# Patient Record
Sex: Female | Born: 1975 | Race: White | Hispanic: No | Marital: Married | Smoking: Never smoker
Health system: Southern US, Community
[De-identification: ages and names within clinical notes are randomized; demographics above are authoritative.]

## PROBLEM LIST (undated history)

## (undated) ENCOUNTER — Inpatient Hospital Stay (HOSPITAL_COMMUNITY): Payer: Self-pay

## (undated) DIAGNOSIS — Z8619 Personal history of other infectious and parasitic diseases: Secondary | ICD-10-CM

## (undated) DIAGNOSIS — I10 Essential (primary) hypertension: Secondary | ICD-10-CM

## (undated) DIAGNOSIS — O24419 Gestational diabetes mellitus in pregnancy, unspecified control: Secondary | ICD-10-CM

## (undated) DIAGNOSIS — IMO0001 Reserved for inherently not codable concepts without codable children: Secondary | ICD-10-CM

## (undated) DIAGNOSIS — E039 Hypothyroidism, unspecified: Secondary | ICD-10-CM

## (undated) DIAGNOSIS — F419 Anxiety disorder, unspecified: Secondary | ICD-10-CM

## (undated) DIAGNOSIS — R51 Headache: Secondary | ICD-10-CM

## (undated) DIAGNOSIS — R030 Elevated blood-pressure reading, without diagnosis of hypertension: Secondary | ICD-10-CM

## (undated) HISTORY — PX: CHOLECYSTECTOMY: SHX55

## (undated) HISTORY — DX: Personal history of other infectious and parasitic diseases: Z86.19

## (undated) HISTORY — PX: TONSILLECTOMY: SUR1361

## (undated) HISTORY — DX: Gestational diabetes mellitus in pregnancy, unspecified control: O24.419

## (undated) HISTORY — DX: Elevated blood-pressure reading, without diagnosis of hypertension: R03.0

## (undated) HISTORY — DX: Reserved for inherently not codable concepts without codable children: IMO0001

---

## 1999-08-29 ENCOUNTER — Emergency Department (HOSPITAL_COMMUNITY): Admission: EM | Admit: 1999-08-29 | Discharge: 1999-08-29 | Payer: Self-pay | Admitting: Emergency Medicine

## 2003-12-25 ENCOUNTER — Emergency Department (HOSPITAL_COMMUNITY): Admission: EM | Admit: 2003-12-25 | Discharge: 2003-12-25 | Payer: Self-pay | Admitting: Emergency Medicine

## 2007-10-09 ENCOUNTER — Emergency Department (HOSPITAL_COMMUNITY): Admission: EM | Admit: 2007-10-09 | Discharge: 2007-10-09 | Payer: Self-pay | Admitting: Emergency Medicine

## 2010-04-24 ENCOUNTER — Encounter: Admission: RE | Admit: 2010-04-24 | Discharge: 2010-04-24 | Payer: Self-pay | Admitting: Family Medicine

## 2010-06-15 ENCOUNTER — Ambulatory Visit (HOSPITAL_COMMUNITY)
Admission: RE | Admit: 2010-06-15 | Discharge: 2010-06-15 | Payer: Self-pay | Source: Home / Self Care | Attending: General Surgery | Admitting: General Surgery

## 2010-07-23 ENCOUNTER — Encounter: Payer: Self-pay | Admitting: Family Medicine

## 2010-09-11 LAB — GLUCOSE, CAPILLARY: Glucose-Capillary: 131 mg/dL — ABNORMAL HIGH (ref 70–99)

## 2010-09-12 LAB — SURGICAL PCR SCREEN
MRSA, PCR: NEGATIVE
Staphylococcus aureus: NEGATIVE

## 2010-09-12 LAB — COMPREHENSIVE METABOLIC PANEL
ALT: 45 U/L — ABNORMAL HIGH (ref 0–35)
AST: 40 U/L — ABNORMAL HIGH (ref 0–37)
Albumin: 4.2 g/dL (ref 3.5–5.2)
CO2: 27 mEq/L (ref 19–32)
Chloride: 100 mEq/L (ref 96–112)
Creatinine, Ser: 0.78 mg/dL (ref 0.4–1.2)
GFR calc Af Amer: >60 mL/min (ref >60)
Sodium: 138 mEq/L (ref 135–145)
Total Bilirubin: 1.1 mg/dL (ref 0.3–1.2)

## 2010-09-12 LAB — CBC
Hemoglobin: 14.4 g/dL (ref 12.0–15.0)
MCH: 29 pg (ref 26.0–34.0)
Platelets: 210 10*3/uL (ref 150–400)
RBC: 4.96 MIL/uL (ref 3.87–5.11)
WBC: 10.6 10*3/uL — ABNORMAL HIGH (ref 4.0–10.5)

## 2010-09-12 LAB — DIFFERENTIAL
Basophils Absolute: 0.1 10*3/uL (ref 0.0–0.1)
Eosinophils Absolute: 0.2 10*3/uL (ref 0.0–0.7)
Eosinophils Relative: 1 % (ref 0–5)
Lymphocytes Relative: 35 % (ref 12–46)
Lymphs Abs: 3.7 10*3/uL (ref 0.7–4.0)
Monocytes Absolute: 0.8 10*3/uL (ref 0.1–1.0)

## 2011-03-27 LAB — POCT URINALYSIS DIP (DEVICE)
Glucose, UA: NEGATIVE
Ketones, ur: NEGATIVE
Operator id: 239701
Specific Gravity, Urine: 1.015

## 2011-11-17 ENCOUNTER — Emergency Department (HOSPITAL_COMMUNITY): Payer: Medicaid Other

## 2011-11-17 ENCOUNTER — Encounter (HOSPITAL_COMMUNITY): Payer: Self-pay | Admitting: *Deleted

## 2011-11-17 ENCOUNTER — Emergency Department (HOSPITAL_COMMUNITY)
Admission: EM | Admit: 2011-11-17 | Discharge: 2011-11-17 | Disposition: A | Discharge status: Home / Self Care | Payer: Medicaid Other | Attending: Emergency Medicine | Admitting: Emergency Medicine

## 2011-11-17 DIAGNOSIS — O24919 Unspecified diabetes mellitus in pregnancy, unspecified trimester: Secondary | ICD-10-CM | POA: Insufficient documentation

## 2011-11-17 DIAGNOSIS — R109 Unspecified abdominal pain: Secondary | ICD-10-CM | POA: Insufficient documentation

## 2011-11-17 DIAGNOSIS — E079 Disorder of thyroid, unspecified: Secondary | ICD-10-CM | POA: Insufficient documentation

## 2011-11-17 DIAGNOSIS — O219 Vomiting of pregnancy, unspecified: Secondary | ICD-10-CM

## 2011-11-17 DIAGNOSIS — O169 Unspecified maternal hypertension, unspecified trimester: Secondary | ICD-10-CM | POA: Insufficient documentation

## 2011-11-17 DIAGNOSIS — R10816 Epigastric abdominal tenderness: Secondary | ICD-10-CM | POA: Insufficient documentation

## 2011-11-17 DIAGNOSIS — O210 Mild hyperemesis gravidarum: Secondary | ICD-10-CM | POA: Insufficient documentation

## 2011-11-17 DIAGNOSIS — E039 Hypothyroidism, unspecified: Secondary | ICD-10-CM | POA: Insufficient documentation

## 2011-11-17 DIAGNOSIS — E119 Type 2 diabetes mellitus without complications: Secondary | ICD-10-CM | POA: Insufficient documentation

## 2011-11-17 HISTORY — DX: Hypothyroidism, unspecified: E03.9

## 2011-11-17 HISTORY — DX: Essential (primary) hypertension: I10

## 2011-11-17 LAB — DIFFERENTIAL
Eosinophils Relative: 1 % (ref 0–5)
Lymphocytes Relative: 24 % (ref 12–46)
Lymphs Abs: 3.1 10*3/uL (ref 0.7–4.0)
Monocytes Absolute: 0.9 10*3/uL (ref 0.1–1.0)
Monocytes Relative: 7 % (ref 3–12)

## 2011-11-17 LAB — CBC
HCT: 39.3 % (ref 36.0–46.0)
MCV: 82.7 fL (ref 78.0–100.0)
RBC: 4.75 MIL/uL (ref 3.87–5.11)
WBC: 13.3 10*3/uL — ABNORMAL HIGH (ref 4.0–10.5)

## 2011-11-17 LAB — COMPREHENSIVE METABOLIC PANEL
ALT: 24 U/L (ref 0–35)
BUN: 5 mg/dL — ABNORMAL LOW (ref 6–23)
CO2: 22 mEq/L (ref 19–32)
Calcium: 9.4 mg/dL (ref 8.4–10.5)
Creatinine, Ser: 0.57 mg/dL (ref 0.50–1.10)
GFR calc Af Amer: >90 mL/min (ref >90)
GFR calc non Af Amer: >90 mL/min (ref >90)
Glucose, Bld: 105 mg/dL — ABNORMAL HIGH (ref 70–99)
Sodium: 135 mEq/L (ref 135–145)

## 2011-11-17 MED ORDER — ONDANSETRON 4 MG PO TBDP
8.0000 mg | ORAL_TABLET | Freq: Once | ORAL | Status: AC
  Administered 2011-11-17: 8 mg via ORAL
  Filled 2011-11-17: qty 2

## 2011-11-17 MED ORDER — SODIUM CHLORIDE 0.9 % IV SOLN
Freq: Once | INTRAVENOUS | Status: AC
  Administered 2011-11-17: 04:00:00 via INTRAVENOUS

## 2011-11-17 MED ORDER — ONDANSETRON HCL 4 MG/2ML IJ SOLN
4.0000 mg | Freq: Once | INTRAMUSCULAR | Status: AC
  Administered 2011-11-17: 4 mg via INTRAVENOUS
  Filled 2011-11-17: qty 2

## 2011-11-17 MED ORDER — ONDANSETRON HCL 8 MG PO TABS: 8.0000 mg | ORAL_TABLET | Freq: Three times a day (TID) | ORAL | Status: AC | PRN

## 2011-11-17 NOTE — ED Notes (Signed)
Patient to ultrasound

## 2011-11-17 NOTE — ED Provider Notes (Signed)
History     CSN: [id number]  Arrival date & time 11/17/11  0213   First MD Initiated Contact with Patient 11/17/11 [phone]      Chief Complaint  Patient presents with  . Emesis    (Consider location/radiation/quality/duration/timing/severity/associated sxs/prior treatment) HPI Comments: Patient is [redacted] weeks pregnant.  Having epigastric discomfort, repeat vomiting for the past two days.    Patient is a 36 y.o. female presenting with vomiting. The history is provided by the patient.  Emesis  This is a new problem. The current episode started 2 days ago. The problem occurs continuously. The problem has not changed since onset.The emesis has an appearance of stomach contents. There has been no fever. Associated symptoms include abdominal pain. Pertinent negatives include no chills, no diarrhea and no fever.    Past Medical History  Diagnosis Date  . Hypothyroid   . Hypertension   . Diabetes mellitus     Past Surgical History  Procedure Date  . Cholecystectomy     History reviewed. No pertinent family history.  History  Substance Use Topics  . Smoking status: Never Smoker   . Smokeless tobacco: Not on file  . Alcohol Use: No    OB History    Grav Para Term Preterm Abortions TAB SAB Ect Mult Living   1               Review of Systems  Constitutional: Negative for fever and chills.  Gastrointestinal: Positive for vomiting and abdominal pain. Negative for diarrhea.  All other systems reviewed and are negative.    Allergies  Clindamycin/lincomycin and Penicillins  Home Medications   Current Outpatient Rx  Name Route Sig Dispense Refill  . DOXYLAMINE SUCCINATE (SLEEP) 25 MG PO TABS Oral Take 25 mg by mouth at bedtime as needed. For sleep      BP 154/83  Pulse 91  Temp(Src) 98.5 F (36.9 C) (Oral)  Resp 18  SpO2 99%  LMP 09/21/2011  Physical Exam  Nursing note and vitals reviewed. Constitutional: She is oriented to person, place, and time. She appears  well-developed and well-nourished. No distress.  HENT:  Head: Normocephalic and atraumatic.  Neck: Normal range of motion. Neck supple.  Cardiovascular: Normal rate and regular rhythm.  Exam reveals no gallop and no friction rub.   No murmur heard. Pulmonary/Chest: Effort normal and breath sounds normal. No respiratory distress. She has no wheezes.  Abdominal: Soft. Bowel sounds are normal. She exhibits no distension. There is no rebound and no guarding.       Mild ttp in the epigastrium.    Musculoskeletal: Normal range of motion.  Neurological: She is alert and oriented to person, place, and time.  Skin: Skin is warm and dry. She is not diaphoretic.    ED Course  Procedures (including critical care time)   Labs Reviewed  CBC  DIFFERENTIAL  COMPREHENSIVE METABOLIC PANEL  LIPASE, BLOOD  HCG, QUANTITATIVE, PREGNANCY   No results found.   No diagnosis found.    MDM  The patient presented with epigastric abd discomfort and vomiting, but no diarrhea.  She is a G1 who is 7-[redacted] weeks pregnant.  Labs look okay and hcg is 43k.  An ultrasound has been ordered to confirm an iup.  This is pending.  At this point, the care will be signed out to Dr. Effie Shy at shift change.        Geoffery Lyons, MD 11/17/11 [phone]

## 2011-11-17 NOTE — ED Notes (Signed)
Pt states that she has been throwing up everything that she tries to eat for the past 2 days. Pt states that she has intermittant sharp abdmonial pain. Pt states she is approxiamtely [redacted] weeks pregnant.

## 2011-11-17 NOTE — ED Provider Notes (Signed)
The patient seemed to evaluate after ultrasound returned. She is comfortable, nauseated, and states that she is ready to leave. The ultrasound shows an uncomplicated [redacted] week gestation. This states that she has a planned OB follow up this week. Repeat vital signs are reassuring.  Evaluation is consistent with morning sickness of pregnancy. Doubt pregnancy complication, that will threaten the fetus.  Plan: Home Medications- Zofran; Home Treatments- advance diet; Recommended follow up- OB f/u asap  Flint Melter, MD 11/17/11 [phone]

## 2011-11-17 NOTE — Discharge Instructions (Signed)
Take small sips of fluids frequently, and eat, bland foods. See an OB doctor as soon as possible for pregnancy care. Go to [facility] hospital if needed for problems.   Morning Sickness Morning sickness is when you feel sick to your stomach (nauseous) during pregnancy. You may feel sick to your stomach and throw up (vomit). You may feel sick in the morning, but you can feel this way any time of day. Some women feel very sick to their stomach and cannot stop throwing up (hyperemesis gravidarum). HOME CARE  Take multivitamins as told by your doctor. Taking multivitamins before getting pregnant can stop or lessen the harshness of morning sickness.   Eat dry toast or unsalted crackers before getting out of bed.   Eat 5 to 6 small meals a day.   Eat dry and bland foods like rice and baked potatoes.   Do not drink liquids with meals. Drink between meals.   Do not eat greasy, fatty, or spicy foods.   Have someone cook for you if the smell of food causes you to feel sick or throw up.   Do not take vitamins with iron, or as told by your doctor.   Eat protein when you need a snack (nuts, yogurt, cheese).   Eat unsweetened gelatins for dessert.   Wear a bracelet used for sea sickness (acupressure wristband).   Go to a doctor that puts thin needles into certain body points (acupuncture) to improve how you feel.   Do not smoke.   Use a humidifier to keep the air in your house free of odors.  GET HELP RIGHT AWAY IF:   You feel very sick to your stomach and cannot stop throwing up.   You pass out (faint).   You have a fever.   You need medicine to feel better.   You feel dizzy or lightheaded.   You are losing weight.   You need help knowing what to eat and what not to eat.  MAKE SURE YOU:   Understand these instructions.   Will watch your condition.   Will get help right away if you are not doing well or get worse.  Document Released: 07/26/2004 Document Revised:  06/07/2011 Document Reviewed: 09/15/2009 [facility] Patient Information 2012 [place], [place].

## 2011-11-17 NOTE — ED Notes (Signed)
Ultrasound tech ready for patient; must have a chaperone.

## 2011-12-10 ENCOUNTER — Inpatient Hospital Stay (HOSPITAL_COMMUNITY)
Admission: AD | Admit: 2011-12-10 | Discharge: 2011-12-10 | Disposition: A | Discharge status: Home / Self Care | Payer: Medicaid Other | Source: Ambulatory Visit | Attending: Obstetrics and Gynecology | Admitting: Obstetrics and Gynecology

## 2011-12-10 ENCOUNTER — Encounter (HOSPITAL_COMMUNITY): Payer: Self-pay | Admitting: *Deleted

## 2011-12-10 DIAGNOSIS — O210 Mild hyperemesis gravidarum: Secondary | ICD-10-CM | POA: Insufficient documentation

## 2011-12-10 DIAGNOSIS — O99891 Other specified diseases and conditions complicating pregnancy: Secondary | ICD-10-CM | POA: Insufficient documentation

## 2011-12-10 DIAGNOSIS — O219 Vomiting of pregnancy, unspecified: Secondary | ICD-10-CM | POA: Diagnosis present

## 2011-12-10 DIAGNOSIS — K219 Gastro-esophageal reflux disease without esophagitis: Secondary | ICD-10-CM | POA: Diagnosis present

## 2011-12-10 HISTORY — DX: Headache: R51

## 2011-12-10 HISTORY — DX: Anxiety disorder, unspecified: F41.9

## 2011-12-10 LAB — URINALYSIS, ROUTINE W REFLEX MICROSCOPIC
Glucose, UA: NEGATIVE mg/dL
Ketones, ur: 15 mg/dL — AB
Leukocytes, UA: NEGATIVE
Protein, ur: NEGATIVE mg/dL

## 2011-12-10 MED ORDER — PROMETHAZINE HCL 25 MG RE SUPP: 25.0000 mg | Freq: Four times a day (QID) | RECTAL | Status: DC | PRN

## 2011-12-10 MED ORDER — GI COCKTAIL ~~LOC~~
30.0000 mL | ORAL | Status: AC
  Administered 2011-12-10: 30 mL via ORAL
  Filled 2011-12-10: qty 30

## 2011-12-10 MED ORDER — FAMOTIDINE 40 MG PO TABS: 40.0000 mg | ORAL_TABLET | Freq: Every day | ORAL | Status: DC

## 2011-12-10 MED ORDER — PROMETHAZINE HCL 25 MG PO TABS
12.5000 mg | ORAL_TABLET | ORAL | Status: AC
  Administered 2011-12-10: 18:00:00 via ORAL
  Filled 2011-12-10: qty 1

## 2011-12-10 MED ORDER — PROMETHAZINE HCL 12.5 MG PO TABS: 12.5000 mg | ORAL_TABLET | Freq: Four times a day (QID) | ORAL | Status: DC | PRN

## 2011-12-10 NOTE — MAU Provider Note (Signed)
Kim Riddle y.o.G1P0 @ 11w 3 d by LMP Chief Complaint  Patient presents with  . Emesis During Pregnancy  . Abdominal Pain     First Provider Initiated Contact with Patient 12/10/11 1752      SUBJECTIVE  HPI: Pt presents to MAU with nausea and vomiting x20 in last 2 days, with epigastric pain after vomiting.  She denies LOF, vaginal bleeding, vaginal itching/burning, urinary symptoms, h/a, dizziness, or fever/chills.    Past Medical History  Diagnosis Date  . Headache   . Hypertension     no meds  . Diabetes mellitus     diet controlled  . Hypothyroid     dx with [place]  . Anxiety    Past Surgical History  Procedure Date  . Cholecystectomy    History   Social History  . Marital Status: Married    Spouse Name: N/A    Number of Children: N/A  . Years of Education: N/A   Occupational History  . Not on file.   Social History Main Topics  . Smoking status: Never Smoker   . Smokeless tobacco: Never Used  . Alcohol Use: No  . Drug Use: No  . Sexually Active: Yes   Other Topics Concern  . Not on file   Social History Narrative  . No narrative on file   No current facility-administered medications on file prior to encounter.   Current Outpatient Prescriptions on File Prior to Encounter  Medication Sig Dispense Refill  . doxylamine, Sleep, (UNISOM) 25 MG tablet Take 25 mg by mouth at bedtime as needed. For sleep       Allergies  Allergen Reactions  . Clindamycin/Lincomycin Rash  . Penicillins Rash    ROS: Pertinent items in HPI  OBJECTIVE Blood pressure 136/84, pulse 88, temperature 99.4 F (37.4 C), temperature source Oral, resp. rate 20, height 5' 5.25" (1.657 m), weight 106.595 kg (235 lb), last menstrual period 09/21/2011, SpO2 99.00%.  GENERAL: Well-developed, well-nourished female in no acute distress.  HEENT: Normocephalic, good dentition HEART: normal rate RESP: normal effort ABDOMEN: Soft, nontender EXTREMITIES: Nontender, no  edema NEURO: Alert and oriented SPECULUM EXAM: Deferred   LAB RESULTS Results for orders placed during the hospital encounter of 12/10/11 (from the past 24 hour(s))  URINALYSIS, ROUTINE W REFLEX MICROSCOPIC     Status: Abnormal   Collection Time   12/10/11  4:21 PM      Component Value Range   Color, Urine YELLOW  YELLOW    APPearance HAZY (*) CLEAR    Specific Gravity, Urine 1.015  1.005 - 1.030    pH 7.5  5.0 - 8.0    Glucose, UA NEGATIVE  NEGATIVE (mg/dL)   Hgb urine dipstick NEGATIVE  NEGATIVE    Bilirubin Urine NEGATIVE  NEGATIVE    Ketones, ur 15 (*) NEGATIVE (mg/dL)   Protein, ur NEGATIVE  NEGATIVE (mg/dL)   Urobilinogen, UA 1.0  0.0 - 1.0 (mg/dL)   Nitrite NEGATIVE  NEGATIVE    Leukocytes, UA NEGATIVE  NEGATIVE      ASSESSMENT N/V of pregnancy Acid reflux in pregnancy  PLAN In MAU:  Phenergan 12.5 mg PO x1 dose  GI Cocktail x1 dose with pt report of improvement of symptoms  Called Dr Claiborne Billings with assessment and findings Pt vomited x1 in MAU prior to D/C  D/C home Phenergan 12.5 mg PO Q 6 hours as needed Phenergan 25 mg PR Q 6 hours PRN when unable to tolerate PO Discussed use of Doxylamine 25  mg BID and B6 25 mg QID and ginger with pt Keep scheduled initial prenatal visit with Dr Kim Riddle, Kim Riddle 12/10/2011 5:53 PM

## 2011-12-10 NOTE — MAU Note (Signed)
Was in ER previously for vomiting, was better a couple days, then started again. Was given zofran- unable to take, makes heart race. First visit is June 21.  Ongoing vomiting, becoming dizzy.  Unable to sleep. Has sharp pain in upper abd - epigastric area, worsens with ongoing vomiting.

## 2011-12-10 NOTE — Discharge Instructions (Signed)
Morning Sickness Morning sickness is when you feel sick to your stomach (nauseous) during pregnancy. This nauseous feeling may or may not come with throwing up (vomiting). It often occurs in the morning, but can be a problem any time of day. While morning sickness is unpleasant, it is usually harmless unless you develop severe and continual vomiting (hyperemesis gravidarum). This condition requires more intense treatment. CAUSES  The cause of morning sickness is not completely known but seems to be related to a sudden increase of two hormones:   Human chorionic gonadotropin (hCG).   Estrogen hormone.  These are elevated in the first part of the pregnancy. TREATMENT  Do not use any medicines (prescription, over-the-counter, or herbal) for morning sickness without first talking to your caregiver. Some patients are helped by the following:  Vitamin B6 (25mg  every 6 hours) or vitamin B6 shots.   An antihistamine called doxylamine (25mg  once or twice per day).   The herbal medication ginger.  Ginger comes in raw form, teas, candies, and many other varieties. HOME CARE INSTRUCTIONS   Taking multivitamins before getting pregnant can prevent or decrease the severity of morning sickness in most women.   Eat a piece of dry toast or unsalted crackers before getting out of bed in the morning.   Eat 5 or 6 small meals a day.   Eat dry and bland foods (rice, baked potato).   Do not drink liquids with your meals. Drink liquids between meals.   Avoid greasy, fatty, and spicy foods.   Get someone to cook for you if the smell of any food causes nausea and vomiting.   Avoid vitamin pills with iron because iron can cause nausea.   Snack on protein foods between meals if you are hungry.   Eat unsweetened gelatins for deserts.   Wear an acupressure wristband (worn for sea sickness) may be helpful.   Acupuncture may be helpful.   Do not smoke.   Get a humidifier to keep the air in your house  free of odors.  SEEK MEDICAL CARE IF:   Your home remedies are not working and you need medication.   You feel dizzy or lightheaded.   You are losing weight.   You need help with your diet.  SEEK IMMEDIATE MEDICAL CARE IF:   You have persistent and uncontrolled nausea and vomiting.   You pass out (faint).   You have a fever.  MAKE SURE YOU:   Understand these instructions.   Will watch your condition.   Will get help right away if you are not doing well or get worse.  Document Released: 08/09/2006 Document Revised: 06/07/2011 Document Reviewed: 06/06/2007 [facility] Patient Information 2012 [place], [place]. Hyperemesis Gravidarum Diet Hyperemesis gravidarum is a severe form of morning sickness. It is characterized by frequent and severe vomiting. It happens during the first trimester of pregnancy. It may be caused by the rapid hormone changes that happen during pregnancy. It is associated with a 5% weight loss of pre-pregnancy weight. The hyperemesis diet may be used to lessen symptoms of nausea and vomiting. EATING GUIDELINES  Eat 5 to 6 small meals daily instead of 3 large meals.   Avoid foods with strong smells.   Avoid drinking 30 minutes before and after meals.   Avoid fried or high-fat foods, such as butter and cream sauces.   Starchy foods are usually well-tolerated, such as cereal, toast, bread, potatoes, pasta, rice, and pretzels.   Eat crackers before you get out of bed in  the morning.   Avoid spicy foods.   Ginger may help with nausea. Add  tsp ginger to hot tea or choose ginger tea.   Continue to take your prenatal vitamins as directed by your caregiver.  SAMPLE MEAL PLAN Breakfast    cup oatmeal   1 slice toast   1 tsp heart-healthy margarine   1 tsp jelly   1 scrambled egg  Midmorning Snack   1 cup low-fat yogurt  Lunch   Plain ham sandwich   Carrot or celery sticks   1 small apple   3 graham crackers  Midafternoon Snack   Cheese  and crackers  Dinner  4 oz pork tenderloin   1 small baked potato   1 tsp margarine    cup broccoli    cup grapes  Evening Snack  1 cup pudding  Document Released: 04/15/2007 Document Revised: 06/07/2011 Document Reviewed: 10/13/2010 [facility] Patient Information 2012 [place], [place].

## 2011-12-24 LAB — OB RESULTS CONSOLE GC/CHLAMYDIA: Chlamydia: NEGATIVE

## 2011-12-24 LAB — OB RESULTS CONSOLE ANTIBODY SCREEN: Antibody Screen: NEGATIVE

## 2011-12-24 LAB — OB RESULTS CONSOLE HEPATITIS B SURFACE ANTIGEN: Hepatitis B Surface Ag: NEGATIVE

## 2011-12-24 LAB — OB RESULTS CONSOLE RUBELLA ANTIBODY, IGM: Rubella: IMMUNE

## 2012-01-18 ENCOUNTER — Encounter (HOSPITAL_COMMUNITY): Payer: Self-pay | Admitting: *Deleted

## 2012-01-18 ENCOUNTER — Inpatient Hospital Stay (HOSPITAL_COMMUNITY)
Admission: AD | Admit: 2012-01-18 | Discharge: 2012-01-18 | Disposition: A | Discharge status: Home / Self Care | Payer: Medicaid Other | Source: Ambulatory Visit | Attending: Obstetrics and Gynecology | Admitting: Obstetrics and Gynecology

## 2012-01-18 DIAGNOSIS — N390 Urinary tract infection, site not specified: Secondary | ICD-10-CM

## 2012-01-18 DIAGNOSIS — O2340 Unspecified infection of urinary tract in pregnancy, unspecified trimester: Secondary | ICD-10-CM

## 2012-01-18 DIAGNOSIS — O2390 Unspecified genitourinary tract infection in pregnancy, unspecified trimester: Secondary | ICD-10-CM

## 2012-01-18 DIAGNOSIS — O210 Mild hyperemesis gravidarum: Secondary | ICD-10-CM | POA: Insufficient documentation

## 2012-01-18 DIAGNOSIS — O219 Vomiting of pregnancy, unspecified: Secondary | ICD-10-CM

## 2012-01-18 LAB — URINALYSIS, ROUTINE W REFLEX MICROSCOPIC
Ketones, ur: 40 mg/dL — AB
Leukocytes, UA: NEGATIVE
Nitrite: POSITIVE — AB
Protein, ur: 30 mg/dL — AB
Urobilinogen, UA: 4 mg/dL — ABNORMAL HIGH (ref 0.0–1.0)

## 2012-01-18 LAB — URINE MICROSCOPIC-ADD ON

## 2012-01-18 MED ORDER — METOCLOPRAMIDE HCL 10 MG PO TABS: 10.0000 mg | ORAL_TABLET | Freq: Three times a day (TID) | ORAL | Status: DC

## 2012-01-18 MED ORDER — NITROFURANTOIN MONOHYD MACRO 100 MG PO CAPS: 100.0000 mg | ORAL_CAPSULE | Freq: Two times a day (BID) | ORAL | Status: AC

## 2012-01-18 MED ORDER — LACTATED RINGERS IV SOLN
Freq: Once | INTRAVENOUS | Status: AC
  Administered 2012-01-18: 11:00:00 via INTRAVENOUS

## 2012-01-18 MED ORDER — METOCLOPRAMIDE HCL 5 MG/ML IJ SOLN
10.0000 mg | Freq: Once | INTRAMUSCULAR | Status: AC
  Administered 2012-01-18: 10 mg via INTRAVENOUS
  Filled 2012-01-18: qty 2

## 2012-01-18 NOTE — MAU Provider Note (Signed)
History     CSN: [id number]  Arrival date and time: 01/18/12 4098   First Provider Initiated Contact with Patient 01/18/12 1017      Chief Complaint  Patient presents with  . Emesis During Pregnancy   HPI  Pt is here for vomiting in pregnancy that worsened this morning.  Reports vomiting three times used phenergan suppository with minimal relief.  Denies fever, body aches, or chills.  Not able to hold down food, able to sip water.    Past Medical History  Diagnosis Date  . Headache   . Hypertension     no meds  . Diabetes mellitus     diet controlled  . Hypothyroid     dx with [place]  . Anxiety     Past Surgical History  Procedure Date  . Cholecystectomy     Family History  Problem Relation Age of Onset  . Anesthesia problems Neg Hx     History  Substance Use Topics  . Smoking status: Never Smoker   . Smokeless tobacco: Never Used  . Alcohol Use: No    Allergies:  Allergies  Allergen Reactions  . Clindamycin/Lincomycin Rash  . Ondansetron Palpitations  . Penicillins Rash    Prescriptions prior to admission  Medication Sig Dispense Refill  . promethazine (PHENERGAN) 25 MG suppository Place 25 mg rectally every 6 (six) hours as needed. nausea      . promethazine (PHENERGAN) 12.5 MG tablet Take 1 tablet (12.5 mg total) by mouth every 6 (six) hours as needed for nausea.  30 tablet  0  . promethazine (PHENERGAN) 25 MG suppository Place 1 suppository (25 mg total) rectally every 6 (six) hours as needed for nausea.  12 each  0    Review of Systems  Gastrointestinal: Positive for nausea and vomiting.  All other systems reviewed and are negative.   Physical Exam   Blood pressure 126/71, pulse 92, temperature 99.1 F (37.3 C), temperature source Oral, resp. rate 18, height 5\' 6"  (1.676 m), weight 101.334 kg (223 lb 6.4 oz), last menstrual period 09/21/2011, SpO2 100.00%.  Physical Exam  Constitutional: She is oriented to person, place, and time. She appears  well-developed and well-nourished.  HENT:  Head: Normocephalic.  Mouth/Throat: Mucous membranes are dry.  Neck: Normal range of motion. Neck supple.  Cardiovascular: Normal rate, regular rhythm and normal heart sounds.   Respiratory: Effort normal and breath sounds normal.  GI: There is no tenderness. There is no CVA tenderness.  Genitourinary: No bleeding around the vagina. No vaginal discharge found.  Neurological: She is alert and oriented to person, place, and time. She has normal reflexes.  Skin: Skin is warm and dry. She is not diaphoretic.    MAU Course  Procedures  Results for orders placed during the hospital encounter of 01/18/12 (from the past 24 hour(s))  URINALYSIS, ROUTINE W REFLEX MICROSCOPIC     Status: Abnormal   Collection Time   01/18/12 10:30 AM      Component Value Range   Color, Urine ORANGE (*) YELLOW   APPearance HAZY (*) CLEAR   Specific Gravity, Urine >1.030 (*) 1.005 - 1.030   pH 6.0  5.0 - 8.0   Glucose, UA NEGATIVE  NEGATIVE mg/dL   Hgb urine dipstick TRACE (*) NEGATIVE   Bilirubin Urine MODERATE (*) NEGATIVE   Ketones, ur 40 (*) NEGATIVE mg/dL   Protein, ur 30 (*) NEGATIVE mg/dL   Urobilinogen, UA 4.0 (*) 0.0 - 1.0 mg/dL   Nitrite POSITIVE (*)  NEGATIVE   Leukocytes, UA NEGATIVE  NEGATIVE  URINE MICROSCOPIC-ADD ON     Status: Abnormal   Collection Time   01/18/12 10:30 AM      Component Value Range   Squamous Epithelial / LPF RARE  RARE   WBC, UA 3-6  <3 WBC/hpf   RBC / HPF 0-2  <3 RBC/hpf   Bacteria, UA MANY (*) RARE   Urine-Other MUCOUS PRESENT     Notified Dr. Dareen Piano regarding pt HPI/exam and plan of care.  Assessment and Plan  Nausea and Vomiting in Pregnancy Urinary Tract Infection  Plan: DC to home Encouraged small frequent meals RX Reglan Urine culture RX Macrobid Follow-up as scheduled  [facility] 01/18/2012, 10:19 AM

## 2012-01-18 NOTE — MAU Note (Signed)
Patient states she has had nausea and vomiting most of the time for the entire pregnancy. Starting to feel dizzy and tired. Sent from the office for IVF's. Has slight back pain, no bleeding.

## 2012-01-20 LAB — URINE CULTURE: Colony Count: >=100000

## 2012-02-20 ENCOUNTER — Encounter: Payer: Medicaid Other | Attending: Obstetrics and Gynecology | Admitting: *Deleted

## 2012-02-20 DIAGNOSIS — O99810 Abnormal glucose complicating pregnancy: Secondary | ICD-10-CM | POA: Insufficient documentation

## 2012-02-20 DIAGNOSIS — Z713 Dietary counseling and surveillance: Secondary | ICD-10-CM | POA: Insufficient documentation

## 2012-02-22 ENCOUNTER — Encounter: Payer: Self-pay | Admitting: *Deleted

## 2012-02-22 NOTE — Patient Instructions (Signed)
Goals:  Check glucose levels per MD as instructed  Follow Gestational Diabetes Diet as instructed  Call for follow-up as needed    

## 2012-02-22 NOTE — Progress Notes (Signed)
  Patient was seen on 02/20/2012 for Gestational Diabetes self-management class at the [facility]. The following learning objectives were met by the patient during this course:   States the definition of Gestational Diabetes  States why dietary management is important in controlling blood glucose  Describes the effects each nutrient has on blood glucose levels  Demonstrates ability to create a balanced meal plan  Demonstrates carbohydrate counting   States when to check blood glucose levels  Demonstrates proper blood glucose monitoring techniques  States the effect of stress and exercise on blood glucose levels  States the importance of limiting caffeine and abstaining from alcohol and smoking  Blood glucose monitor given: Accu Chek Nano BG Monitoring Kit Lot # [id number] Exp: 05-31-2013 Blood glucose reading: 95 mg/dl  Patient instructed to monitor glucose levels: FBS: 60 - <90 2 hour: <120  *Patient received handouts:  Nutrition Diabetes and Pregnancy  Carbohydrate Counting List  Patient will be seen for follow-up as needed.

## 2012-06-17 ENCOUNTER — Encounter (HOSPITAL_COMMUNITY): Payer: Self-pay | Admitting: *Deleted

## 2012-06-17 ENCOUNTER — Observation Stay (HOSPITAL_COMMUNITY)
Admission: AD | Admit: 2012-06-17 | Discharge: 2012-06-17 | DRG: 781 | Disposition: A | Discharge status: Home / Self Care | Payer: Medicaid Other | Source: Ambulatory Visit | Attending: Obstetrics and Gynecology | Admitting: Obstetrics and Gynecology

## 2012-06-17 DIAGNOSIS — O479 False labor, unspecified: Secondary | ICD-10-CM | POA: Diagnosis present

## 2012-06-17 DIAGNOSIS — O99810 Abnormal glucose complicating pregnancy: Principal | ICD-10-CM | POA: Diagnosis present

## 2012-06-17 NOTE — Progress Notes (Signed)
Md notified of pt arrival. Md notified of pt status, no ucs, fhr and healt hx. MD aware of GDM with no meds. Instructed RN to DC pt home with instructions.

## 2012-06-17 NOTE — Progress Notes (Signed)
RN explained poc of md. Pt verbalized understanding. Pt to call office for appointment. Fhr reactive and reassuring. Cosigned by second RN

## 2012-06-26 ENCOUNTER — Telehealth (HOSPITAL_COMMUNITY): Payer: Self-pay | Admitting: *Deleted

## 2012-06-26 ENCOUNTER — Encounter (HOSPITAL_COMMUNITY): Payer: Self-pay | Admitting: *Deleted

## 2012-06-26 NOTE — Telephone Encounter (Signed)
Preadmission screen  

## 2012-06-27 IMAGING — RF DG CHOLANGIOGRAM OPERATIVE
1 series · 5 of 5 positions shown · non-contrast
Comparison: Abdominal ultrasound 04/24/2010

CLINICAL DATA: Symptomatic cholelithiasis.

INTRAOPERATIVE CHOLANGIOGRAM
TECHNIQUE: Cholangiographic images from the C-arm fluoroscopic
device were submitted for interpretation post-operatively.  Please
see the procedural report for the amount of contrast and the
fluoroscopy time utilized.

[Series 1: run · 2 acquisitions, 5 frames shown]
[im 1/2]
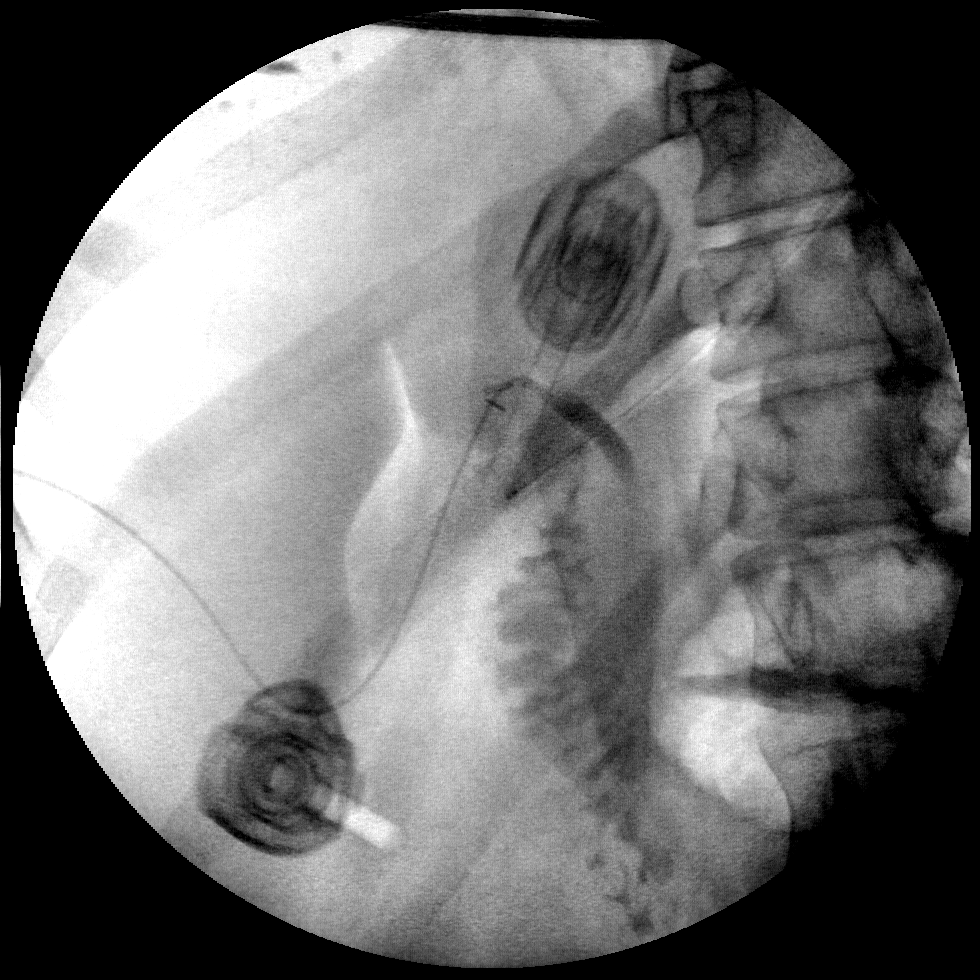
[im 1/2]
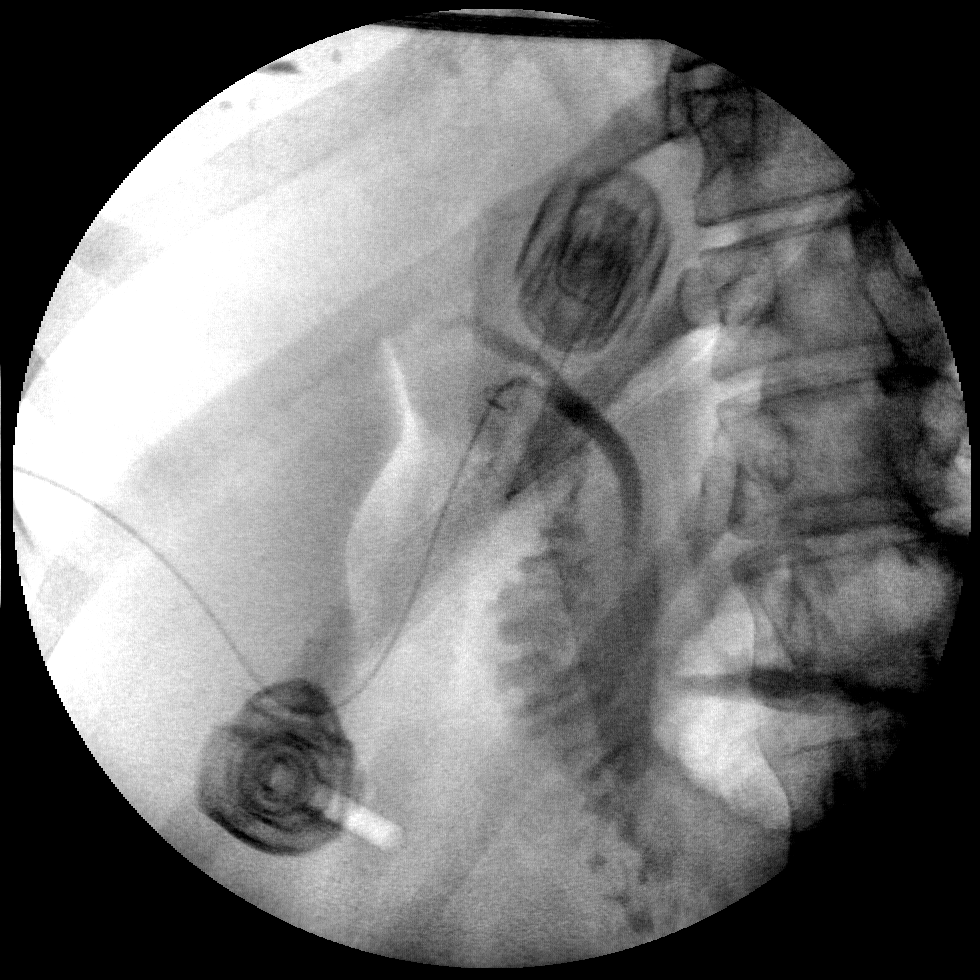
[im 1/2]
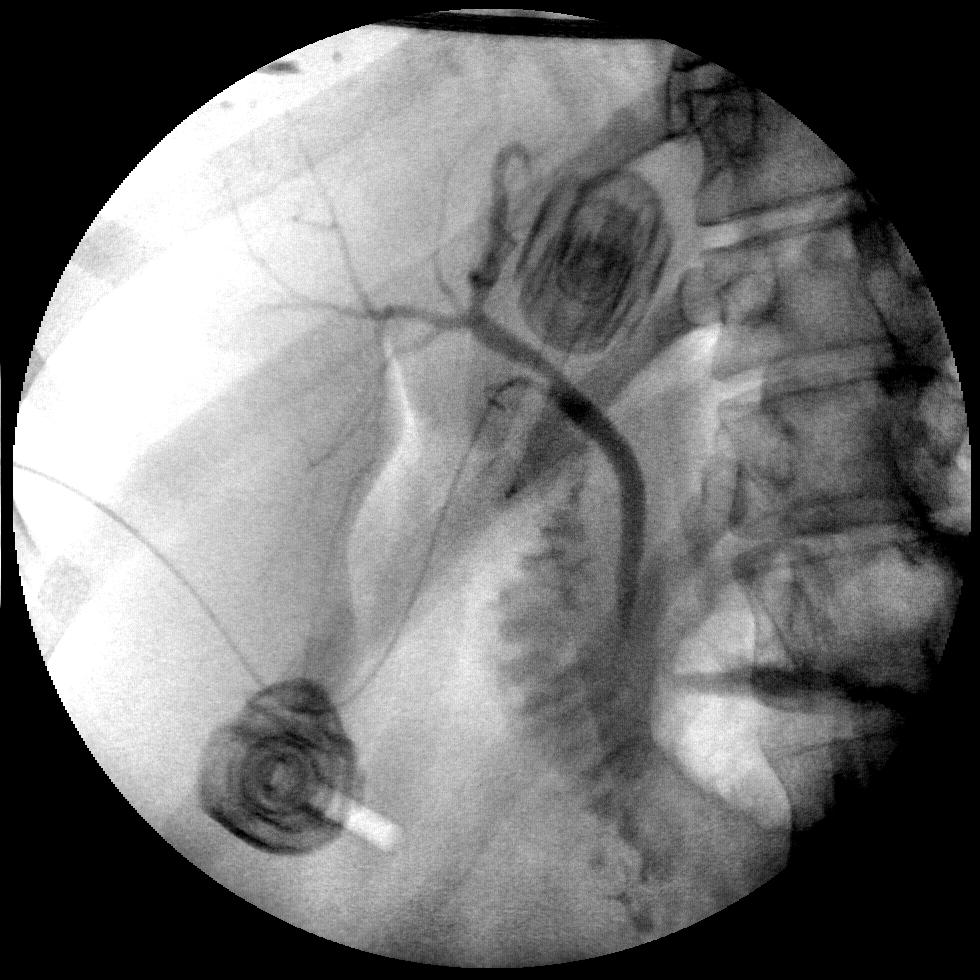
[im 1/2]
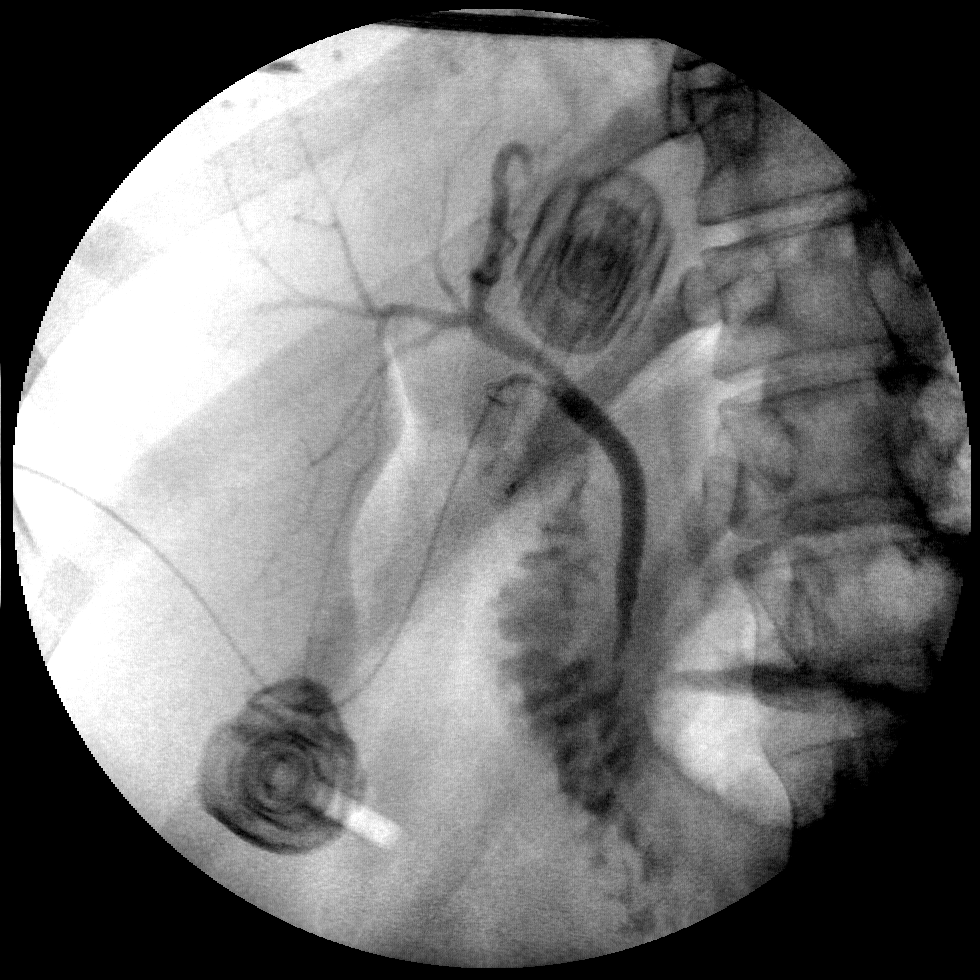
[im 2/2]
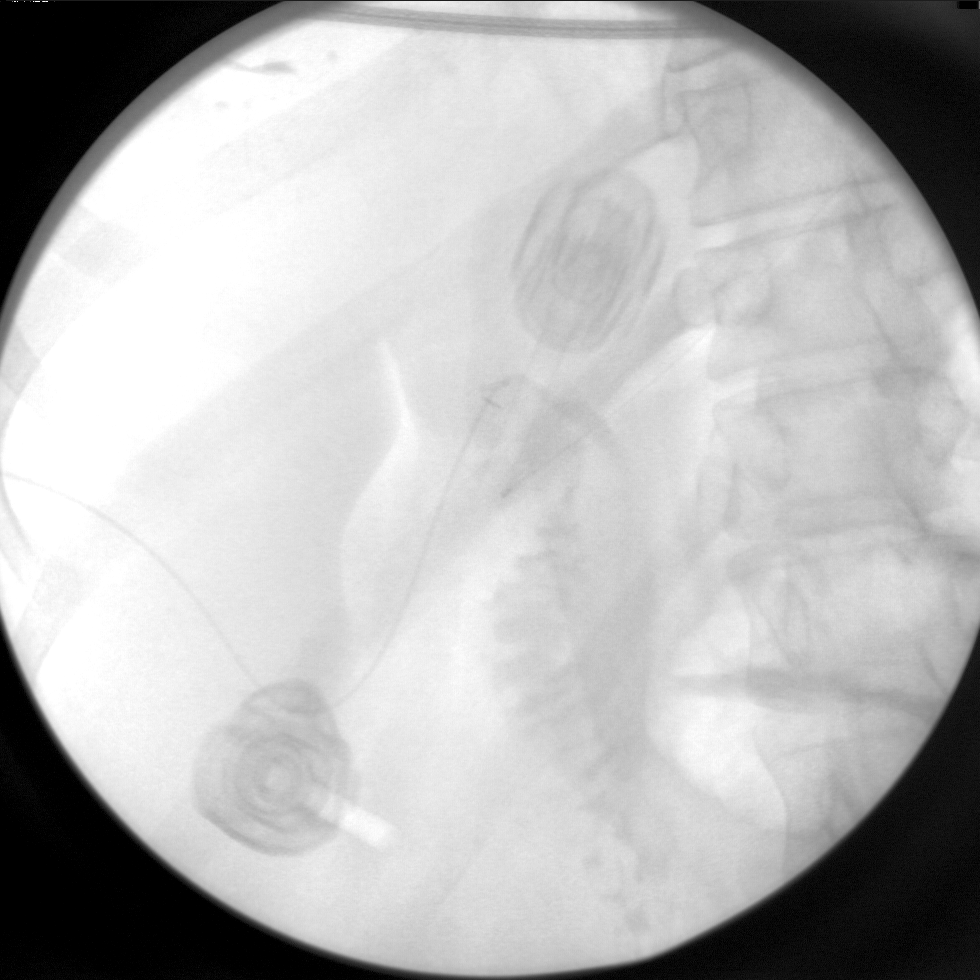

[5 of 5 positions shown; findings below may reference images not displayed]

FINDINGS: Injection of the cystic duct remnant demonstrates a
normal caliber biliary system without retained calculi.  There is
drainage into the duodenum.  No extravasation is demonstrated.
IMPRESSION: Negative for retained calculus, ductal obstruction or
extravasation.

## 2012-06-30 ENCOUNTER — Inpatient Hospital Stay (HOSPITAL_COMMUNITY)
Admission: RE | Admit: 2012-06-30 | Discharge: 2012-07-03 | DRG: 765 | Disposition: A | Discharge status: Home / Self Care | Payer: Medicaid Other | Source: Ambulatory Visit | Attending: Obstetrics and Gynecology | Admitting: Obstetrics and Gynecology

## 2012-06-30 ENCOUNTER — Encounter (HOSPITAL_COMMUNITY): Payer: Self-pay

## 2012-06-30 VITALS — BP 149/93 | HR 96 | Temp 97.8°F | Resp 18 | Ht 66.0 in | Wt 240.0 lb

## 2012-06-30 DIAGNOSIS — O480 Post-term pregnancy: Principal | ICD-10-CM | POA: Diagnosis present

## 2012-06-30 DIAGNOSIS — O1002 Pre-existing essential hypertension complicating childbirth: Secondary | ICD-10-CM | POA: Diagnosis present

## 2012-06-30 DIAGNOSIS — E039 Hypothyroidism, unspecified: Secondary | ICD-10-CM | POA: Diagnosis present

## 2012-06-30 DIAGNOSIS — O24419 Gestational diabetes mellitus in pregnancy, unspecified control: Secondary | ICD-10-CM

## 2012-06-30 DIAGNOSIS — E079 Disorder of thyroid, unspecified: Secondary | ICD-10-CM | POA: Diagnosis present

## 2012-06-30 DIAGNOSIS — O34219 Maternal care for unspecified type scar from previous cesarean delivery: Secondary | ICD-10-CM

## 2012-06-30 LAB — CBC
HCT: 32.6 % — ABNORMAL LOW (ref 36.0–46.0)
Hemoglobin: 10.7 g/dL — ABNORMAL LOW (ref 12.0–15.0)
MCV: 80.5 fL (ref 78.0–100.0)
RBC: 4.05 MIL/uL (ref 3.87–5.11)
RDW: 15.2 % (ref 11.5–15.5)
WBC: 13.4 10*3/uL — ABNORMAL HIGH (ref 4.0–10.5)

## 2012-06-30 LAB — TYPE AND SCREEN
ABO/RH(D): O NEG
Antibody Screen: NEGATIVE

## 2012-06-30 LAB — ABO/RH: ABO/RH(D): O NEG

## 2012-06-30 LAB — GLUCOSE, RANDOM: Glucose, Bld: 93 mg/dL (ref 70–99)

## 2012-06-30 MED ORDER — TERBUTALINE SULFATE 1 MG/ML IJ SOLN: 0.2500 mg | Freq: Once | INTRAMUSCULAR | Status: AC | PRN

## 2012-06-30 MED ORDER — FLEET ENEMA 7-19 GM/118ML RE ENEM: 1.0000 | ENEMA | RECTAL | Status: DC | PRN

## 2012-06-30 MED ORDER — OXYTOCIN BOLUS FROM INFUSION: 500.0000 mL | INTRAVENOUS | Status: DC

## 2012-06-30 MED ORDER — PHENYLEPHRINE 40 MCG/ML (10ML) SYRINGE FOR IV PUSH (FOR BLOOD PRESSURE SUPPORT)
80.0000 ug | PREFILLED_SYRINGE | INTRAVENOUS | Status: DC | PRN
  Filled 2012-06-30: qty 5

## 2012-06-30 MED ORDER — OXYTOCIN 40 UNITS IN LACTATED RINGERS INFUSION - SIMPLE MED
1.0000 m[IU]/min | INTRAVENOUS | Status: DC
  Administered 2012-06-30: 2 m[IU]/min via INTRAVENOUS
  Administered 2012-06-30: 6 m[IU]/min via INTRAVENOUS
  Filled 2012-06-30: qty 1000

## 2012-06-30 MED ORDER — PROMETHAZINE HCL 25 MG/ML IJ SOLN
12.5000 mg | Freq: Four times a day (QID) | INTRAMUSCULAR | Status: DC | PRN
  Administered 2012-06-30: 12.5 mg via INTRAVENOUS
  Filled 2012-06-30: qty 1

## 2012-06-30 MED ORDER — FENTANYL 2.5 MCG/ML BUPIVACAINE 1/10 % EPIDURAL INFUSION (WH - ANES)
14.0000 mL/h | INTRAMUSCULAR | Status: DC
  Administered 2012-06-30 (×2): 14 mL/h via EPIDURAL
  Filled 2012-06-30 (×3): qty 125

## 2012-06-30 MED ORDER — EPHEDRINE 5 MG/ML INJ: 10.0000 mg | INTRAVENOUS | Status: DC | PRN

## 2012-06-30 MED ORDER — LACTATED RINGERS IV SOLN
INTRAVENOUS | Status: DC
  Administered 2012-06-30: 950 mL via INTRAVENOUS
  Administered 2012-06-30 (×2): 1000 mL via INTRAVENOUS
  Administered 2012-06-30: via INTRAVENOUS

## 2012-06-30 MED ORDER — OXYCODONE-ACETAMINOPHEN 5-325 MG PO TABS: 1.0000 | ORAL_TABLET | ORAL | Status: DC | PRN

## 2012-06-30 MED ORDER — LIDOCAINE HCL (PF) 1 % IJ SOLN: 30.0000 mL | INTRAMUSCULAR | Status: DC | PRN

## 2012-06-30 MED ORDER — EPHEDRINE 5 MG/ML INJ
10.0000 mg | INTRAVENOUS | Status: DC | PRN
  Filled 2012-06-30: qty 4

## 2012-06-30 MED ORDER — BUTORPHANOL TARTRATE 1 MG/ML IJ SOLN
1.0000 mg | INTRAMUSCULAR | Status: DC | PRN
  Administered 2012-06-30: 1 mg via INTRAVENOUS
  Filled 2012-06-30: qty 1

## 2012-06-30 MED ORDER — ACETAMINOPHEN 325 MG PO TABS: 650.0000 mg | ORAL_TABLET | ORAL | Status: DC | PRN

## 2012-06-30 MED ORDER — CITRIC ACID-SODIUM CITRATE 334-500 MG/5ML PO SOLN
30.0000 mL | ORAL | Status: DC | PRN
  Administered 2012-07-01: 30 mL via ORAL
  Filled 2012-06-30: qty 15

## 2012-06-30 MED ORDER — LACTATED RINGERS IV SOLN: 500.0000 mL | Freq: Once | INTRAVENOUS | Status: DC

## 2012-06-30 MED ORDER — DIPHENHYDRAMINE HCL 50 MG/ML IJ SOLN: 12.5000 mg | INTRAMUSCULAR | Status: DC | PRN

## 2012-06-30 MED ORDER — LACTATED RINGERS IV SOLN
500.0000 mL | INTRAVENOUS | Status: DC | PRN
  Administered 2012-06-30: 300 mL via INTRAVENOUS
  Administered 2012-06-30 (×2): 500 mL via INTRAVENOUS

## 2012-06-30 MED ORDER — IBUPROFEN 600 MG PO TABS: 600.0000 mg | ORAL_TABLET | Freq: Four times a day (QID) | ORAL | Status: DC | PRN

## 2012-06-30 MED ORDER — OXYTOCIN 40 UNITS IN LACTATED RINGERS INFUSION - SIMPLE MED: 62.5000 mL/h | INTRAVENOUS | Status: DC

## 2012-06-30 MED ORDER — SODIUM BICARBONATE 8.4 % IV SOLN
INTRAVENOUS | Status: DC | PRN
  Administered 2012-06-30: 5 mL via EPIDURAL

## 2012-06-30 MED ORDER — PHENYLEPHRINE 40 MCG/ML (10ML) SYRINGE FOR IV PUSH (FOR BLOOD PRESSURE SUPPORT): 80.0000 ug | PREFILLED_SYRINGE | INTRAVENOUS | Status: DC | PRN

## 2012-06-30 NOTE — Progress Notes (Signed)
Dr Henderson Cloud updated on pt status, mvu's, pitocin mmu, fhr , to continue to increase pitocin to achieve greater mvu's.

## 2012-06-30 NOTE — H&P (Signed)
36 y.o. [redacted]w[redacted]d  G1P0 comes in for induction at postdates.  Otherwise has good fetal movement and no bleeding.  Past Medical History  Diagnosis Date  . Hypertension     no meds  . Hypothyroid     dx with [place]  . H/O varicella   . Elevated blood pressure     no meds  . Diabetes mellitus     diet controlled, dx 3 yrs ago and then resolved  . Gestational diabetes     diet controlled  . Anxiety     no meds  . Headache     migraines    Past Surgical History  Procedure Date  . Cholecystectomy     OB History    Grav Para Term Preterm Abortions TAB SAB Ect Mult Living   1              # Outc Date GA Lbr Len/2nd Wgt Sex Del Anes PTL Lv   1 CUR               History   Social History  . Marital Status: Married    Spouse Name: N/A    Number of Children: N/A  . Years of Education: N/A   Occupational History  . Not on file.   Social History Main Topics  . Smoking status: Never Smoker   . Smokeless tobacco: Never Used  . Alcohol Use: No  . Drug Use: No  . Sexually Active: Yes    Birth Control/ Protection: None   Other Topics Concern  . Not on file   Social History Narrative  . No narrative on file   Clindamycin/lincomycin; Ondansetron; and Penicillins    Prenatal Transfer Tool  Maternal Diabetes: Yes:  Diabetes Type:  Pre-pregnancy, Diet controlled Genetic Screening: Normal Maternal Ultrasounds/Referrals: Normal Fetal Ultrasounds or other Referrals:  None Maternal Substance Abuse:  No Significant Maternal Medications:  None Significant Maternal Lab Results: None  Other PNC: Pt had TFTs done in pregnacy; reported normal.    Filed Vitals:   06/30/12 0632  Temp: 97.6 F (36.4 C)  Resp: 20     Lungs/Cor:  NAD Abdomen:  soft, gravid Ex:  no cords, erythema SVE:  2/80/-2, AROM clear FHTs:  130, good STV, NST R Toco:  q occ   A/P   Term induction.  Early 3hr GTT was abnormal but pt remained diet controlled throughout pregnancy-  She is to be treated like  A1GDM.  GBS neg.  Priseis Cratty A

## 2012-06-30 NOTE — H&P (Signed)
Pt comfortable with epidural.  FHts 140s, gstv, NST R, with earlies or mild variables: class II  Toco q 3  SVE 4/c/-2  Continue induction.  Pt had U/S at 38 weeks which was 6#13, 55%ile.

## 2012-06-30 NOTE — Anesthesia Procedure Notes (Signed)

## 2012-06-30 NOTE — Anesthesia Preprocedure Evaluation (Signed)
Anesthesia Evaluation  Patient identified by MRN, date of birth, ID band Patient awake    Reviewed: Allergy & Precautions, H&P , Patient's Chart, lab work & pertinent test results  Airway Mallampati: II  TM Distance: >3 FB Neck ROM: full    Dental  (+) Teeth Intact   Pulmonary  breath sounds clear to auscultation        Cardiovascular Rhythm:regular Rate:Normal     Neuro/Psych    GI/Hepatic   Endo/Other  diabetes, GestationalMorbid obesity  Renal/GU      Musculoskeletal   Abdominal   Peds  Hematology   Anesthesia Other Findings       Reproductive/Obstetrics (+) Pregnancy                             Anesthesia Physical Anesthesia Plan  ASA: III  Anesthesia Plan: Epidural   Post-op Pain Management:    Induction:   Airway Management Planned:   Additional Equipment:   Intra-op Plan:   Post-operative Plan:   Informed Consent: I have reviewed the patients History and Physical, chart, labs and discussed the procedure including the risks, benefits and alternatives for the proposed anesthesia with the patient or authorized representative who has indicated his/her understanding and acceptance.   Dental Advisory Given  Plan Discussed with:   Anesthesia Plan Comments: (Labs checked- platelets confirmed with RN in room. Fetal heart tracing, per RN, reported to be stable enough for sitting procedure. Discussed epidural, and patient consents to the procedure:  included risk of possible headache,backache, failed block, allergic reaction, and nerve injury. This patient was asked if she had any questions or concerns before the procedure started.)        Anesthesia Quick Evaluation  

## 2012-07-01 ENCOUNTER — Inpatient Hospital Stay (HOSPITAL_COMMUNITY): Payer: Medicaid Other | Admitting: Anesthesiology

## 2012-07-01 ENCOUNTER — Encounter (HOSPITAL_COMMUNITY)
Admission: RE | Disposition: A | Discharge status: Home / Self Care | Payer: Self-pay | Source: Ambulatory Visit | Attending: Obstetrics and Gynecology

## 2012-07-01 ENCOUNTER — Encounter (HOSPITAL_COMMUNITY): Payer: Self-pay

## 2012-07-01 ENCOUNTER — Encounter (HOSPITAL_COMMUNITY): Payer: Self-pay | Admitting: Anesthesiology

## 2012-07-01 LAB — CBC
HCT: 31.1 % — ABNORMAL LOW (ref 36.0–46.0)
MCHC: 31.8 g/dL (ref 30.0–36.0)
MCV: 81.4 fL (ref 78.0–100.0)
Platelets: 136 10*3/uL — ABNORMAL LOW (ref 150–400)
RDW: 15.4 % (ref 11.5–15.5)

## 2012-07-01 LAB — CCBB MATERNAL DONOR DRAW

## 2012-07-01 SURGERY — Surgical Case
Anesthesia: Epidural | Site: Abdomen | Wound class: Clean Contaminated

## 2012-07-01 MED ORDER — SODIUM BICARBONATE 8.4 % IV SOLN
INTRAVENOUS | Status: AC
  Filled 2012-07-01: qty 50

## 2012-07-01 MED ORDER — METRONIDAZOLE IN NACL 5-0.79 MG/ML-% IV SOLN
500.0000 mg | Freq: Once | INTRAVENOUS | Status: DC
  Filled 2012-07-01: qty 100

## 2012-07-01 MED ORDER — SIMETHICONE 80 MG PO CHEW
80.0000 mg | CHEWABLE_TABLET | Freq: Three times a day (TID) | ORAL | Status: DC
  Administered 2012-07-01 – 2012-07-03 (×6): 80 mg via ORAL

## 2012-07-01 MED ORDER — CEFAZOLIN SODIUM-DEXTROSE 2-3 GM-% IV SOLR: 2.0000 g | Freq: Three times a day (TID) | INTRAVENOUS | Status: DC

## 2012-07-01 MED ORDER — NALOXONE HCL 1 MG/ML IJ SOLN
1.0000 ug/kg/h | INTRAMUSCULAR | Status: DC | PRN
  Filled 2012-07-01: qty 2

## 2012-07-01 MED ORDER — SODIUM CHLORIDE 0.9 % IJ SOLN: 3.0000 mL | INTRAMUSCULAR | Status: DC | PRN

## 2012-07-01 MED ORDER — PROMETHAZINE HCL 25 MG PO TABS: 12.5000 mg | ORAL_TABLET | Freq: Four times a day (QID) | ORAL | Status: DC | PRN

## 2012-07-01 MED ORDER — GENTAMICIN SULFATE 40 MG/ML IJ SOLN
5.0000 mg/kg | Freq: Once | INTRAVENOUS | Status: DC
  Filled 2012-07-01: qty 9.89

## 2012-07-01 MED ORDER — NALOXONE HCL 0.4 MG/ML IJ SOLN: 0.4000 mg | INTRAMUSCULAR | Status: DC | PRN

## 2012-07-01 MED ORDER — MEPERIDINE HCL 25 MG/ML IJ SOLN: 6.2500 mg | INTRAMUSCULAR | Status: DC | PRN

## 2012-07-01 MED ORDER — SCOPOLAMINE 1 MG/3DAYS TD PT72
MEDICATED_PATCH | TRANSDERMAL | Status: AC
  Administered 2012-07-01: 1.5 mg via TRANSDERMAL
  Filled 2012-07-01: qty 1

## 2012-07-01 MED ORDER — NALBUPHINE HCL 10 MG/ML IJ SOLN
5.0000 mg | INTRAMUSCULAR | Status: DC | PRN
  Filled 2012-07-01: qty 1

## 2012-07-01 MED ORDER — METRONIDAZOLE IN NACL 5-0.79 MG/ML-% IV SOLN
INTRAVENOUS | Status: DC | PRN
  Administered 2012-07-01: .5 g via INTRAVENOUS

## 2012-07-01 MED ORDER — LACTATED RINGERS IV SOLN
INTRAVENOUS | Status: DC
  Administered 2012-07-01: 17:00:00 via INTRAVENOUS

## 2012-07-01 MED ORDER — LACTATED RINGERS IV SOLN
INTRAVENOUS | Status: DC
  Administered 2012-07-01: 03:00:00 via INTRAUTERINE

## 2012-07-01 MED ORDER — FLEET ENEMA 7-19 GM/118ML RE ENEM: 1.0000 | ENEMA | Freq: Every day | RECTAL | Status: DC | PRN

## 2012-07-01 MED ORDER — METHYLERGONOVINE MALEATE 0.2 MG/ML IJ SOLN: 0.2000 mg | INTRAMUSCULAR | Status: DC | PRN

## 2012-07-01 MED ORDER — MIDAZOLAM HCL 2 MG/2ML IJ SOLN: 0.5000 mg | Freq: Once | INTRAMUSCULAR | Status: DC | PRN

## 2012-07-01 MED ORDER — LACTATED RINGERS IV SOLN
INTRAVENOUS | Status: DC | PRN
  Administered 2012-07-01: 05:00:00 via INTRAVENOUS

## 2012-07-01 MED ORDER — METOCLOPRAMIDE HCL 5 MG/ML IJ SOLN: 10.0000 mg | Freq: Three times a day (TID) | INTRAMUSCULAR | Status: DC | PRN

## 2012-07-01 MED ORDER — PROMETHAZINE HCL 25 MG/ML IJ SOLN: 6.2500 mg | INTRAMUSCULAR | Status: DC | PRN

## 2012-07-01 MED ORDER — MEPERIDINE HCL 25 MG/ML IJ SOLN
6.2500 mg | INTRAMUSCULAR | Status: DC | PRN
  Administered 2012-07-01: 12.5 mg via INTRAVENOUS

## 2012-07-01 MED ORDER — MEASLES, MUMPS & RUBELLA VAC ~~LOC~~ INJ
0.5000 mL | INJECTION | Freq: Once | SUBCUTANEOUS | Status: DC
  Filled 2012-07-01: qty 0.5

## 2012-07-01 MED ORDER — PHENYLEPHRINE HCL 10 MG/ML IJ SOLN
INTRAMUSCULAR | Status: DC | PRN
  Administered 2012-07-01 (×6): 80 ug via INTRAVENOUS

## 2012-07-01 MED ORDER — SCOPOLAMINE 1 MG/3DAYS TD PT72
1.0000 | MEDICATED_PATCH | Freq: Once | TRANSDERMAL | Status: DC
  Administered 2012-07-01: 1.5 mg via TRANSDERMAL

## 2012-07-01 MED ORDER — WITCH HAZEL-GLYCERIN EX PADS: 1.0000 "application " | MEDICATED_PAD | CUTANEOUS | Status: DC | PRN

## 2012-07-01 MED ORDER — METHYLERGONOVINE MALEATE 0.2 MG PO TABS: 0.2000 mg | ORAL_TABLET | ORAL | Status: DC | PRN

## 2012-07-01 MED ORDER — GENTAMICIN SULFATE 40 MG/ML IJ SOLN: 544.5000 mg | INTRAVENOUS | Status: DC | PRN

## 2012-07-01 MED ORDER — SENNOSIDES-DOCUSATE SODIUM 8.6-50 MG PO TABS
2.0000 | ORAL_TABLET | Freq: Every day | ORAL | Status: DC
  Administered 2012-07-01 – 2012-07-02 (×2): 2 via ORAL

## 2012-07-01 MED ORDER — ONDANSETRON HCL 4 MG/2ML IJ SOLN
INTRAMUSCULAR | Status: DC | PRN
  Administered 2012-07-01: 4 mg via INTRAVENOUS

## 2012-07-01 MED ORDER — OXYTOCIN 10 UNIT/ML IJ SOLN
INTRAMUSCULAR | Status: AC
  Filled 2012-07-01: qty 4

## 2012-07-01 MED ORDER — MORPHINE SULFATE 0.5 MG/ML IJ SOLN
INTRAMUSCULAR | Status: AC
  Filled 2012-07-01: qty 10

## 2012-07-01 MED ORDER — 0.9 % SODIUM CHLORIDE (POUR BTL) OPTIME
TOPICAL | Status: DC | PRN
  Administered 2012-07-01: 1000 mL

## 2012-07-01 MED ORDER — FERROUS SULFATE 325 (65 FE) MG PO TABS
325.0000 mg | ORAL_TABLET | Freq: Two times a day (BID) | ORAL | Status: DC
  Administered 2012-07-02 – 2012-07-03 (×3): 325 mg via ORAL
  Filled 2012-07-01 (×4): qty 1

## 2012-07-01 MED ORDER — KETOROLAC TROMETHAMINE 30 MG/ML IJ SOLN
30.0000 mg | Freq: Four times a day (QID) | INTRAMUSCULAR | Status: AC | PRN
  Administered 2012-07-01: 30 mg via INTRAVENOUS

## 2012-07-01 MED ORDER — KETOROLAC TROMETHAMINE 30 MG/ML IJ SOLN
INTRAMUSCULAR | Status: AC
  Administered 2012-07-01: 30 mg via INTRAVENOUS
  Filled 2012-07-01: qty 1

## 2012-07-01 MED ORDER — DIBUCAINE 1 % RE OINT: 1.0000 "application " | TOPICAL_OINTMENT | RECTAL | Status: DC | PRN

## 2012-07-01 MED ORDER — DIPHENHYDRAMINE HCL 25 MG PO CAPS: 25.0000 mg | ORAL_CAPSULE | Freq: Four times a day (QID) | ORAL | Status: DC | PRN

## 2012-07-01 MED ORDER — KETOROLAC TROMETHAMINE 30 MG/ML IJ SOLN: 30.0000 mg | Freq: Four times a day (QID) | INTRAMUSCULAR | Status: AC | PRN

## 2012-07-01 MED ORDER — OXYCODONE-ACETAMINOPHEN 5-325 MG PO TABS
1.0000 | ORAL_TABLET | ORAL | Status: DC | PRN
  Administered 2012-07-02 (×2): 1 via ORAL
  Filled 2012-07-01: qty 2
  Filled 2012-07-01 (×2): qty 1

## 2012-07-01 MED ORDER — OXYTOCIN 10 UNIT/ML IJ SOLN
40.0000 [IU] | INTRAVENOUS | Status: DC | PRN
  Administered 2012-07-01: 40 [IU] via INTRAVENOUS

## 2012-07-01 MED ORDER — LIDOCAINE-EPINEPHRINE (PF) 2 %-1:200000 IJ SOLN
INTRAMUSCULAR | Status: AC
  Filled 2012-07-01: qty 20

## 2012-07-01 MED ORDER — PROMETHAZINE HCL 25 MG RE SUPP: 25.0000 mg | Freq: Four times a day (QID) | RECTAL | Status: DC | PRN

## 2012-07-01 MED ORDER — DIPHENHYDRAMINE HCL 50 MG/ML IJ SOLN: 25.0000 mg | INTRAMUSCULAR | Status: DC | PRN

## 2012-07-01 MED ORDER — OXYTOCIN 40 UNITS IN LACTATED RINGERS INFUSION - SIMPLE MED: 62.5000 mL/h | INTRAVENOUS | Status: AC

## 2012-07-01 MED ORDER — IBUPROFEN 600 MG PO TABS
600.0000 mg | ORAL_TABLET | Freq: Four times a day (QID) | ORAL | Status: DC
  Administered 2012-07-01 – 2012-07-03 (×7): 600 mg via ORAL
  Filled 2012-07-01 (×7): qty 1

## 2012-07-01 MED ORDER — MEPERIDINE HCL 25 MG/ML IJ SOLN
INTRAMUSCULAR | Status: AC
  Filled 2012-07-01: qty 1

## 2012-07-01 MED ORDER — DIPHENHYDRAMINE HCL 50 MG/ML IJ SOLN: 12.5000 mg | INTRAMUSCULAR | Status: DC | PRN

## 2012-07-01 MED ORDER — MEPERIDINE HCL 25 MG/ML IJ SOLN
INTRAMUSCULAR | Status: DC | PRN
  Administered 2012-07-01: 12.5 mg via INTRAVENOUS

## 2012-07-01 MED ORDER — SIMETHICONE 80 MG PO CHEW: 80.0000 mg | CHEWABLE_TABLET | ORAL | Status: DC | PRN

## 2012-07-01 MED ORDER — GENTAMICIN SULFATE 40 MG/ML IJ SOLN
544.5000 mg | INTRAVENOUS | Status: DC | PRN
  Administered 2012-07-01: 395.6 mL via INTRAVENOUS

## 2012-07-01 MED ORDER — MENTHOL 3 MG MT LOZG: 1.0000 | LOZENGE | OROMUCOSAL | Status: DC | PRN

## 2012-07-01 MED ORDER — LANOLIN HYDROUS EX OINT: 1.0000 "application " | TOPICAL_OINTMENT | CUTANEOUS | Status: DC | PRN

## 2012-07-01 MED ORDER — FENTANYL CITRATE 0.05 MG/ML IJ SOLN: 25.0000 ug | INTRAMUSCULAR | Status: DC | PRN

## 2012-07-01 MED ORDER — LACTATED RINGERS IV SOLN
INTRAVENOUS | Status: DC | PRN
  Administered 2012-07-01: 06:00:00 via INTRAVENOUS

## 2012-07-01 MED ORDER — MEPERIDINE HCL 25 MG/ML IJ SOLN
INTRAMUSCULAR | Status: AC
  Administered 2012-07-01: 12.5 mg via INTRAVENOUS
  Filled 2012-07-01: qty 1

## 2012-07-01 MED ORDER — BISACODYL 10 MG RE SUPP: 10.0000 mg | Freq: Every day | RECTAL | Status: DC | PRN

## 2012-07-01 MED ORDER — ZOLPIDEM TARTRATE 5 MG PO TABS: 5.0000 mg | ORAL_TABLET | Freq: Every evening | ORAL | Status: DC | PRN

## 2012-07-01 MED ORDER — ONDANSETRON HCL 4 MG/2ML IJ SOLN
INTRAMUSCULAR | Status: AC
  Filled 2012-07-01: qty 2

## 2012-07-01 MED ORDER — DIPHENHYDRAMINE HCL 25 MG PO CAPS: 25.0000 mg | ORAL_CAPSULE | ORAL | Status: DC | PRN

## 2012-07-01 MED ORDER — PRENATAL MULTIVITAMIN CH
1.0000 | ORAL_TABLET | Freq: Every day | ORAL | Status: DC
  Administered 2012-07-02 – 2012-07-03 (×2): 1 via ORAL
  Filled 2012-07-01 (×2): qty 1

## 2012-07-01 MED ORDER — PHENYLEPHRINE 40 MCG/ML (10ML) SYRINGE FOR IV PUSH (FOR BLOOD PRESSURE SUPPORT)
PREFILLED_SYRINGE | INTRAVENOUS | Status: AC
  Filled 2012-07-01: qty 15

## 2012-07-01 MED ORDER — TETANUS-DIPHTH-ACELL PERTUSSIS 5-2.5-18.5 LF-MCG/0.5 IM SUSP
0.5000 mL | Freq: Once | INTRAMUSCULAR | Status: AC
  Administered 2012-07-02: 0.5 mL via INTRAMUSCULAR
  Filled 2012-07-01: qty 0.5

## 2012-07-01 SURGICAL SUPPLY — 34 items
ADH SKN CLS APL DERMABOND .7 (GAUZE/BANDAGES/DRESSINGS) ×1
CLOTH BEACON ORANGE TIMEOUT ST (SAFETY) ×2 IMPLANT
DERMABOND ADVANCED (GAUZE/BANDAGES/DRESSINGS) ×1
DERMABOND ADVANCED .7 DNX12 (GAUZE/BANDAGES/DRESSINGS) IMPLANT
DRAPE LG THREE QUARTER DISP (DRAPES) ×1 IMPLANT
DRSG OPSITE POSTOP 4X10 (GAUZE/BANDAGES/DRESSINGS) ×2 IMPLANT
DURAPREP 26ML APPLICATOR (WOUND CARE) ×2 IMPLANT
ELECT REM PT RETURN 9FT ADLT (ELECTROSURGICAL) ×2
ELECTRODE REM PT RTRN 9FT ADLT (ELECTROSURGICAL) ×1 IMPLANT
EXTRACTOR VACUUM BELL STYLE (SUCTIONS) IMPLANT
GLOVE BIO SURGEON STRL SZ7 (GLOVE) ×4 IMPLANT
GOWN PREVENTION PLUS LG XLONG (DISPOSABLE) ×4 IMPLANT
KIT ABG SYR 3ML LUER SLIP (SYRINGE) IMPLANT
NDL HYPO 25X5/8 SAFETYGLIDE (NEEDLE) IMPLANT
NEEDLE HYPO 25X5/8 SAFETYGLIDE (NEEDLE) IMPLANT
NS IRRIG 1000ML POUR BTL (IV SOLUTION) ×2 IMPLANT
PACK C SECTION WH (CUSTOM PROCEDURE TRAY) ×2 IMPLANT
PAD OB MATERNITY 4.3X12.25 (PERSONAL CARE ITEMS) ×1 IMPLANT
RETRACTOR WND ALEXIS 25 LRG (MISCELLANEOUS) ×1 IMPLANT
RTRCTR WOUND ALEXIS 25CM LRG (MISCELLANEOUS) ×2
SLEEVE SCD COMPRESS KNEE MED (MISCELLANEOUS) ×1 IMPLANT
STAPLER VISISTAT 35W (STAPLE) IMPLANT
SUT MNCRL 0 VIOLET CTX 36 (SUTURE) ×2 IMPLANT
SUT MONOCRYL 0 CTX 36 (SUTURE) ×2
SUT PDS AB 0 CTX 60 (SUTURE) IMPLANT
SUT PLAIN 2 0 XLH (SUTURE) ×1 IMPLANT
SUT VIC AB 0 CT1 27 (SUTURE) ×6
SUT VIC AB 0 CT1 27XBRD ANBCTR (SUTURE) ×2 IMPLANT
SUT VIC AB 2-0 CT1 27 (SUTURE) ×2
SUT VIC AB 2-0 CT1 TAPERPNT 27 (SUTURE) ×1 IMPLANT
SUT VIC AB 4-0 KS 27 (SUTURE) ×1 IMPLANT
TOWEL OR 17X24 6PK STRL BLUE (TOWEL DISPOSABLE) ×6 IMPLANT
TRAY FOLEY CATH 14FR (SET/KITS/TRAYS/PACK) ×1 IMPLANT
WATER STERILE IRR 1000ML POUR (IV SOLUTION) ×1 IMPLANT

## 2012-07-01 NOTE — Progress Notes (Signed)
FHTs have remained cat 2 all night; moderate variables responded to amnioinfusion; NST R.  Toco good MVU  SVE still 4/90/-2  ; no progress in over 10 hours.  Pt is failed induction for postdates.

## 2012-07-01 NOTE — Brief Op Note (Signed)
06/30/2012 - 07/01/2012  5:55 AM  PATIENT:  Kim Riddle  36 y.o. female  PRE-OPERATIVE DIAGNOSIS:  Failure to Progress  POST-OPERATIVE DIAGNOSIS:  Failure to Progress  PROCEDURE:  Procedure(s) (LRB) with comments: CESAREAN SECTION (N/A) - Primary Cesarean Section Delivery Baby Boy @ [phone], Apgars 9/9  SURGEON:  Surgeon(s) and Role:    * Loney Laurence, MD - Primary   ANESTHESIA:   epidural  EBL:  Total I/O In: 650 [I.V.:600; IV Piggyback:50] Out: 3100 [Urine:2400; Blood:700]   SPECIMEN:  No Specimen  DISPOSITION OF SPECIMEN:  N/A  COUNTS:  YES  TOURNIQUET:  * No tourniquets in log *  DICTATION: .Note written in EPIC  PLAN OF CARE: Admit to inpatient   PATIENT DISPOSITION:  PACU - hemodynamically stable.   Delay start of Pharmacological VTE agent (>24hrs) due to surgical blood loss or risk of bleeding: not applicable  Complications:  none Medications: Flagyl/Gent, Pitocin Findings:  Baby female, Apgars 9,9, weight P.   Normal tubes, ovaries and uterus seen.  Technique:  After adequate epidural anesthesia was achieved, the patient was prepped and draped in usual sterile fashion.  A foley catheter was used to drain the bladder.  A pfannanstiel incision was made with the scalpel and carried down to the fascia with the bovie cautery. The fascia was incised in the midline with the scalpel and carried in a transverse curvilinear manner bilaterally.  The fascia was reflected superiorly and inferiorly off the rectus muscles and the muscles split in the midline.  A bowel free portion of the peritoneum was entered bluntly and then extended in a superior and inferior manner with good visualization of the bowel and bladder.  The Alexis instrument was then placed and the vesico-uterine fascia tented up and incised in a transverse curvilinear manner.  A 2 cm transverse incision was made in the upper portion of the lower uterine segment until the amnion was exposed.   The  incision was extended transversely in a blunt manner.  Clear fluid was noted and the baby delivered in the vertex presentation without complication.  A nuchal cord was reduced.  The baby was bulb suctioned and the cord was clamped and cut.  The baby was then handed to awaiting Neonatology.  The placenta was then delivered manually and the uterus cleared of all debris.  The uterine incision was then closed with a running lock stitch of 0 monocryl.  An imbricating layer of 0 monocryl was closed as well. Good hemostasis of the uterine incision was achieved and the abdomen was cleared with irrigation.  The peritoneum was closed with a running stitch of 2-0 vicryl.  This incorporated the rectus muscles as a separate layer.  The fascia was then closed with a running stitch of 0 vicryl.  The subcutaneous layer was closed with interrupted  stitches of 2-0 plain gut.  The skin was closed with 3-0 vicryl on a Mellody Dance and dermabond.  The patient tolerated the procedure well and was returned to the recovery room in stable condition.  All counts were correct times three.  Senetra Dillin A

## 2012-07-01 NOTE — Op Note (Signed)
06/30/2012 - 07/01/2012  5:55 AM  PATIENT:  Kim Riddle  36 y.o. female  PRE-OPERATIVE DIAGNOSIS:  Failure to Progress  POST-OPERATIVE DIAGNOSIS:  Failure to Progress  PROCEDURE:  Procedure(s) (LRB) with comments: CESAREAN SECTION (N/A) - Primary Cesarean Section Delivery Baby Boy @ 0529, Apgars 9/9  SURGEON:  Surgeon(s) and Role:    * Yarixa Lightcap A Atalaya Zappia, MD - Primary   ANESTHESIA:   epidural  EBL:  Total I/O In: 650 [I.V.:600; IV Piggyback:50] Out: 3100 [Urine:2400; Blood:700]   SPECIMEN:  No Specimen  DISPOSITION OF SPECIMEN:  N/A  COUNTS:  YES  TOURNIQUET:  * No tourniquets in log *  DICTATION: .Note written in EPIC  PLAN OF CARE: Admit to inpatient   PATIENT DISPOSITION:  PACU - hemodynamically stable.   Delay start of Pharmacological VTE agent (>24hrs) due to surgical blood loss or risk of bleeding: not applicable  Complications:  none Medications: Flagyl/Gent, Pitocin Findings:  Baby female, Apgars 9,9, weight P.   Normal tubes, ovaries and uterus seen.  Technique:  After adequate epidural anesthesia was achieved, the patient was prepped and draped in usual sterile fashion.  A foley catheter was used to drain the bladder.  A pfannanstiel incision was made with the scalpel and carried down to the fascia with the bovie cautery. The fascia was incised in the midline with the scalpel and carried in a transverse curvilinear manner bilaterally.  The fascia was reflected superiorly and inferiorly off the rectus muscles and the muscles split in the midline.  A bowel free portion of the peritoneum was entered bluntly and then extended in a superior and inferior manner with good visualization of the bowel and bladder.  The Alexis instrument was then placed and the vesico-uterine fascia tented up and incised in a transverse curvilinear manner.  A 2 cm transverse incision was made in the upper portion of the lower uterine segment until the amnion was exposed.   The  incision was extended transversely in a blunt manner.  Clear fluid was noted and the baby delivered in the vertex presentation without complication.  A nuchal cord was reduced.  The baby was bulb suctioned and the cord was clamped and cut.  The baby was then handed to awaiting Neonatology.  The placenta was then delivered manually and the uterus cleared of all debris.  The uterine incision was then closed with a running lock stitch of 0 monocryl.  An imbricating layer of 0 monocryl was closed as well. Good hemostasis of the uterine incision was achieved and the abdomen was cleared with irrigation.  The peritoneum was closed with a running stitch of 2-0 vicryl.  This incorporated the rectus muscles as a separate layer.  The fascia was then closed with a running stitch of 0 vicryl.  The subcutaneous layer was closed with interrupted  stitches of 2-0 plain gut.  The skin was closed with 3-0 vicryl on a Keith and dermabond.  The patient tolerated the procedure well and was returned to the recovery room in stable condition.  All counts were correct times three.  Roni Friberg A    

## 2012-07-01 NOTE — Anesthesia Postprocedure Evaluation (Signed)
Anesthesia Post Note  Patient: Kim Riddle  Procedure(s) Performed: Procedure(s) (LRB): CESAREAN SECTION (N/A)  Anesthesia type: Epidural  Patient location: PACU  Post pain: Pain level controlled  Post assessment: Post-op Vital signs reviewed  Last Vitals:  Filed Vitals:   07/01/12 0630  BP: 143/88  Pulse: 87  Temp:   Resp: 20    Post vital signs: Reviewed  Level of consciousness: awake  Complications: No apparent anesthesia complications

## 2012-07-01 NOTE — Progress Notes (Signed)
Patient was referred for history of depression/anxiety.  * Referral screened out by Clinical Social Worker because none of the following criteria appear to apply:  ~ History of anxiety/depression during this pregnancy, or of post-partum depression.  ~ Diagnosis of anxiety and/or depression within last 7 years, as per pt.  ~ History of depression due to pregnancy loss/loss of child  OR  * Patient's symptoms currently being treated with medication and/or therapy.  Please contact the Clinical Social Worker if needs arise, or by the patient's request.

## 2012-07-01 NOTE — Transfer of Care (Signed)
Immediate Anesthesia Transfer of Care Note  Patient: Kim Riddle  Procedure(s) Performed: Procedure(s) (LRB) with comments: CESAREAN SECTION (N/A) - Primary Cesarean Section Delivery Baby Boy @ [phone], Apgars 9/9  Patient Location: PACU  Anesthesia Type:Epidural  Level of Consciousness: awake, alert  and oriented  Airway & Oxygen Therapy: Patient Spontanous Breathing  Post-op Assessment: Report given to PACU RN  Post vital signs: Reviewed and stable  Complications: No apparent anesthesia complications

## 2012-07-01 NOTE — Addendum Note (Signed)
Addendum  created 07/01/12 1005 by Gertie Fey, CRNA   Modules edited:Notes Section

## 2012-07-01 NOTE — Progress Notes (Signed)
Dr Henderson Cloud called for update on pt status, she reviewed tracing from home, orders received to start amnioinfusion.

## 2012-07-01 NOTE — Progress Notes (Signed)
Dr Henderson Cloud at bedside discussing risks and benefits of primary c section, pt verbalizes and understands, to proceed with primary c section.

## 2012-07-01 NOTE — Anesthesia Postprocedure Evaluation (Signed)
  Anesthesia Post-op Note  Patient: Kim Riddle  Procedure(s) Performed: Procedure(s) (LRB) with comments: CESAREAN SECTION (N/A) - Primary Cesarean Section Delivery Baby Boy @ [phone], Apgars 9/9  Patient Location: Mother/Baby  Anesthesia Type:Spinal  Level of Consciousness: awake, alert  and oriented  Airway and Oxygen Therapy: Patient Spontanous Breathing  Post-op Pain: none  Post-op Assessment: Post-op Vital signs reviewed  Post-op Vital Signs: Reviewed and stable  Complications: No apparent anesthesia complications

## 2012-07-02 ENCOUNTER — Encounter (HOSPITAL_COMMUNITY): Payer: Self-pay | Admitting: Obstetrics and Gynecology

## 2012-07-02 LAB — CBC
HCT: 27.5 % — ABNORMAL LOW (ref 36.0–46.0)
HCT: 25.9 % — ABNORMAL LOW (ref 36.0–46.0)
Hemoglobin: 8.2 g/dL — ABNORMAL LOW (ref 12.0–15.0)
MCH: 26 pg (ref 26.0–34.0)
MCV: 83.1 fL (ref 78.0–100.0)
RBC: 3.31 MIL/uL — ABNORMAL LOW (ref 3.87–5.11)
RDW: 15.8 % — ABNORMAL HIGH (ref 11.5–15.5)
WBC: 13.3 10*3/uL — ABNORMAL HIGH (ref 4.0–10.5)
WBC: 11.8 10*3/uL — ABNORMAL HIGH (ref 4.0–10.5)

## 2012-07-02 MED ORDER — OXYCODONE-ACETAMINOPHEN 5-325 MG PO TABS: 2.0000 | ORAL_TABLET | ORAL | Status: DC | PRN

## 2012-07-02 MED ORDER — DOCUSATE SODIUM 100 MG PO CAPS: 100.0000 mg | ORAL_CAPSULE | Freq: Two times a day (BID) | ORAL | Status: DC

## 2012-07-02 MED ORDER — IBUPROFEN 600 MG PO TABS: 600.0000 mg | ORAL_TABLET | Freq: Four times a day (QID) | ORAL | Status: DC | PRN

## 2012-07-02 NOTE — Progress Notes (Signed)
Peds did not release the infant for discharge due to poor feeding and borderline bili. Patient has had some elevated BPs on/off today but no symptoms.  Pre-E labs pending.  Pt has switched to bottle feeding  Per RN, pt anxious for discharge POD#2

## 2012-07-02 NOTE — Discharge Summary (Signed)
Obstetric Discharge Summary Reason for Admission: induction of labor Prenatal Procedures: NST and ultrasound Intrapartum Procedures: cesarean: low cervical, transverse Postpartum Procedures: none Complications-Operative and Postpartum: none Hemoglobin  Date Value Range Status  07/02/2012 8.6* 12.0 - 15.0 g/dL Final     HCT  Date Value Range Status  07/02/2012 27.5* 36.0 - 46.0 % Final    Physical Exam:  General: alert, cooperative and appears stated age 37: appropriate Uterine Fundus: firm Incision: healing well DVT Evaluation: No evidence of DVT seen on physical exam.  Discharge Diagnoses: Term Pregnancy-delivered and Post-date pregnancy  Discharge Information: Date: 07/02/2012 Activity: pelvic rest Diet: routine Medications: Ibuprofen, Colace and Percocet Condition: improved Instructions: refer to practice specific booklet Discharge to: home Follow-up Information    Follow up with HORVATH,MICHELLE A, MD. (Friday for a BP check)    Contact information:   [address] [place] [address] [phone]       Follow up with HORVATH,MICHELLE A, MD. In 4 weeks. (For a postpartum evaluation)    Contact information:   [address] [place] [address] [phone]          Newborn Data: Live born female  Birth Weight: 7 lb 10.9 oz (3485 g) APGAR: 9, 9  Home with mother.  Masson Nalepa H. 07/02/2012, 9:44 AM

## 2012-07-02 NOTE — Progress Notes (Addendum)
Subjective: Postpartum Day 1: Cesarean Delivery Patient reports pain controlled, bleeding appropriate   Objective: Vital signs in last 24 hours: Filed Vitals:   07/01/12 1430 07/01/12 1640 07/01/12 2045 07/02/12 0028  BP: 136/82 121/73 115/72 127/79  Pulse: 102 109 91 93  Temp: 99.3 F (37.4 C) 99.3 F (37.4 C) 98.2 F (36.8 C) 98.2 F (36.8 C)  TempSrc: Oral Oral Oral Oral  Resp: 20 18 18 18   Height:      Weight:      SpO2: 97% 96% 96% 98%    Physical Exam:  General: alert, cooperative and appears stated age 37: appropriate Uterine Fundus: firm Incision: healing well DVT Evaluation: No evidence of DVT seen on physical exam.   Basename 07/02/12 0520 07/01/12 0625  HGB 8.6* 9.9*  HCT 27.5* 31.1*    Assessment/Plan: Status post Cesarean section. Doing well postoperatively.  Continue current care. Declines Circ Desires early D/C today POD#1 d/t having both sets of parents at home to help her and husband.  Cautioned against going home early.  Advised to talk to the pediatrician.  If patient is adamant will d/c home later this evening.  Kim Riddle H. 07/02/2012, 9:07 AM

## 2012-07-03 LAB — LACTATE DEHYDROGENASE: LDH: 191 U/L (ref 94–250)

## 2012-07-03 LAB — COMPREHENSIVE METABOLIC PANEL
ALT: 9 U/L (ref 0–35)
Albumin: 2.2 g/dL — ABNORMAL LOW (ref 3.5–5.2)
Alkaline Phosphatase: 88 U/L (ref 39–117)
BUN: 4 mg/dL — ABNORMAL LOW (ref 6–23)
Chloride: 103 mEq/L (ref 96–112)
Potassium: 4.2 mEq/L (ref 3.5–5.1)
Sodium: 137 mEq/L (ref 135–145)
Total Bilirubin: 0.1 mg/dL — ABNORMAL LOW (ref 0.3–1.2)

## 2012-07-03 MED ORDER — FERROUS SULFATE 325 (65 FE) MG PO TABS: 325.0000 mg | ORAL_TABLET | Freq: Two times a day (BID) | ORAL | Status: DC

## 2013-11-29 IMAGING — US US OB COMP LESS 14 WK
1 series · 14 of 28 positions shown · non-contrast
Comparison: None.

CLINICAL DATA: Vomiting.

OBSTETRIC <14 WK ULTRASOUND
TECHNIQUE: Transabdominal ultrasound was performed for evaluation
of the gestation as well as the maternal uterus and adnexal
regions.

[Series 1: us ob comp less 14 wk · 0.26mm/px · 14 of 35 slices shown]
[im 2/35]
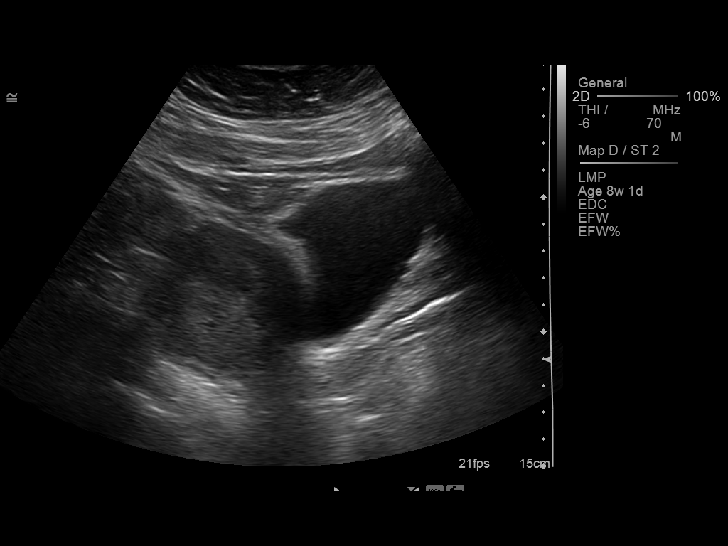
[im 4/35]
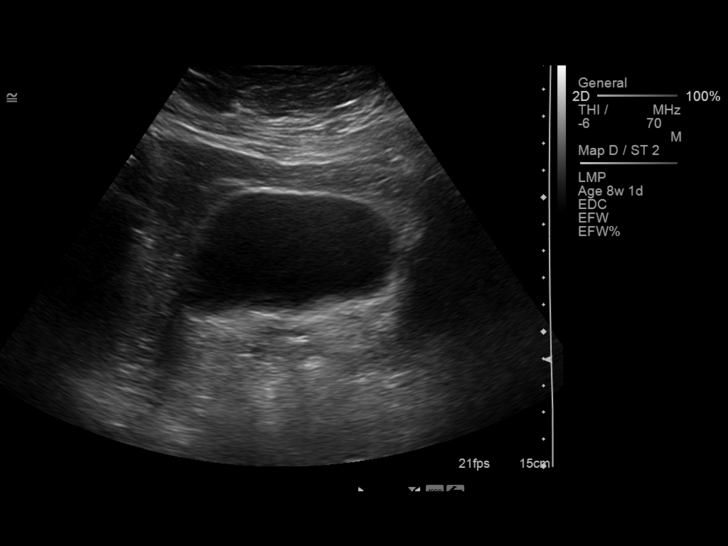
[im 7/35]
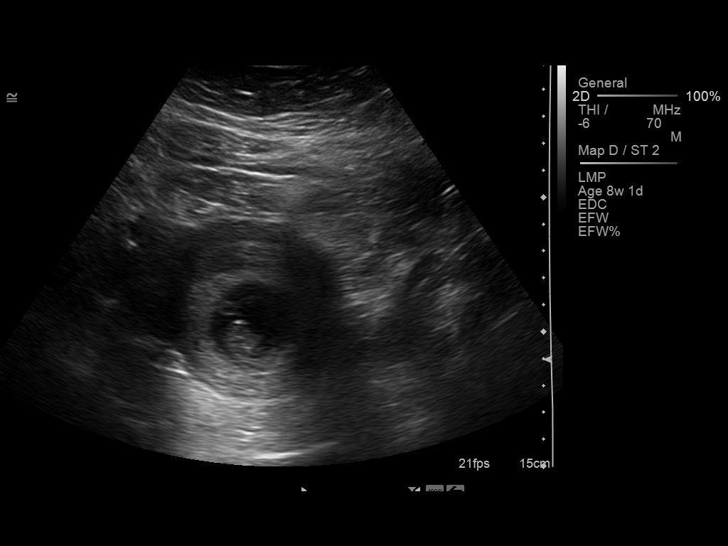
[im 9/35]
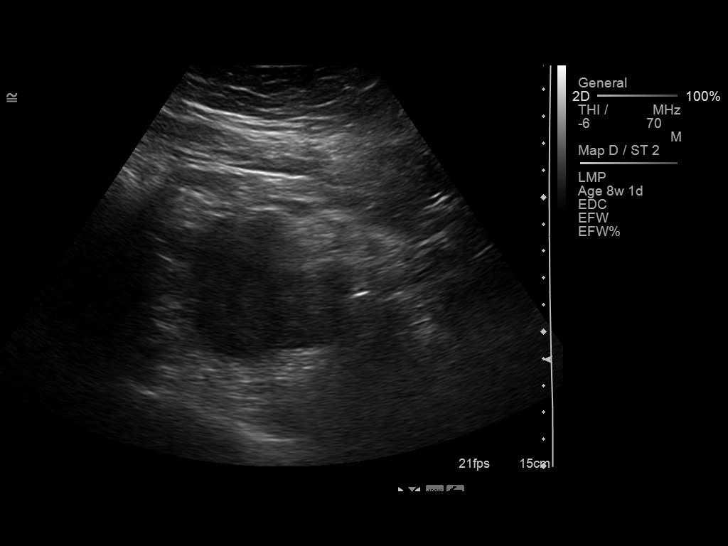
[im 12/35]
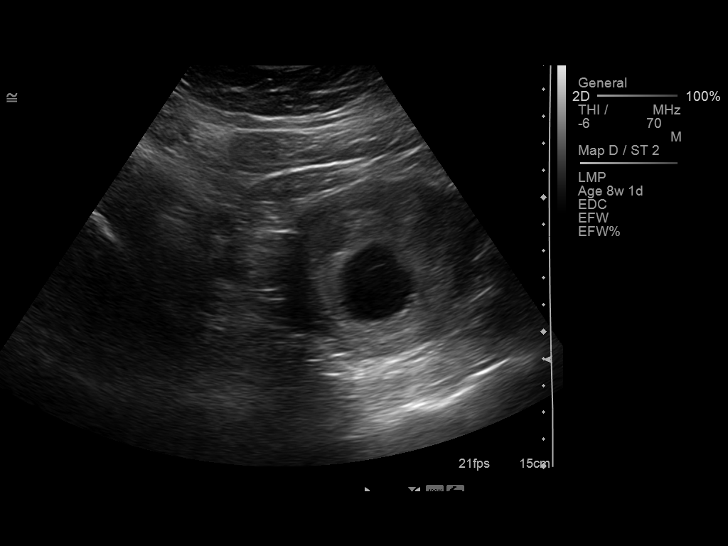
[im 14/35]
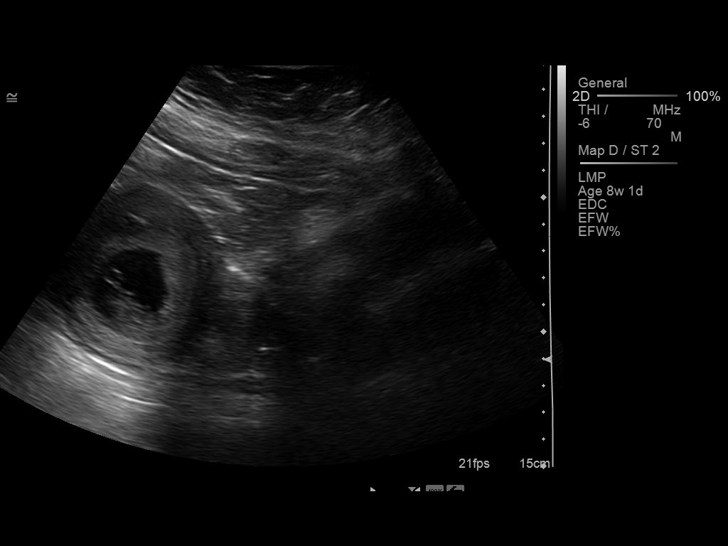
[im 17/35]
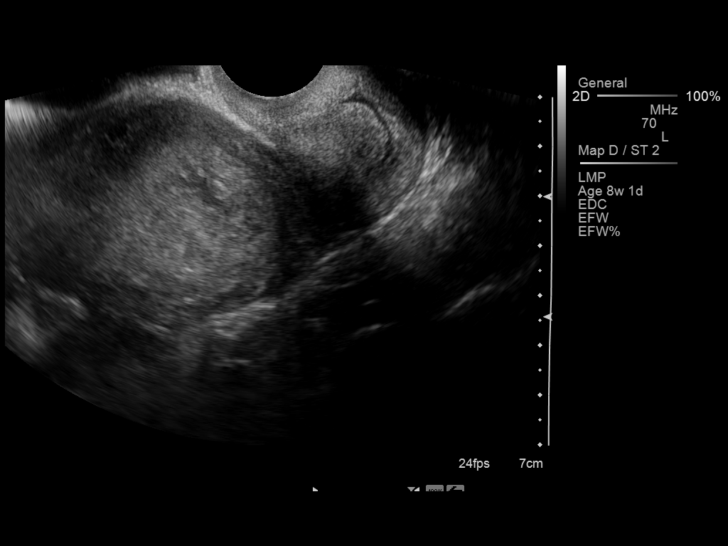
[im 19/35]
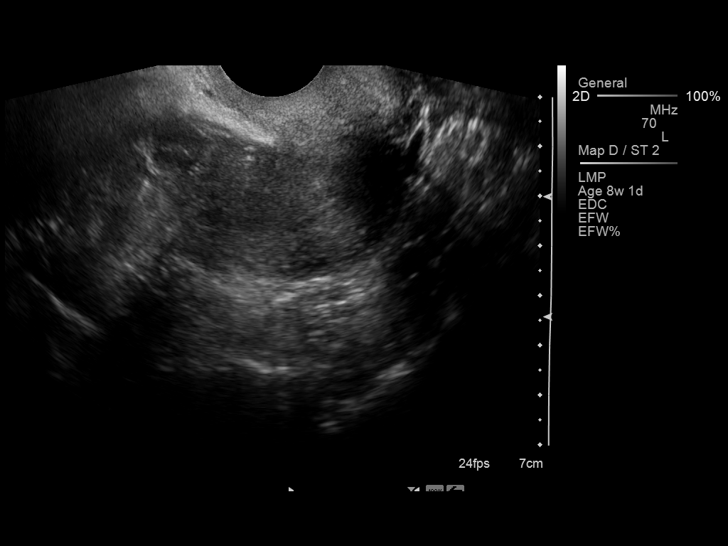
[im 22/35]
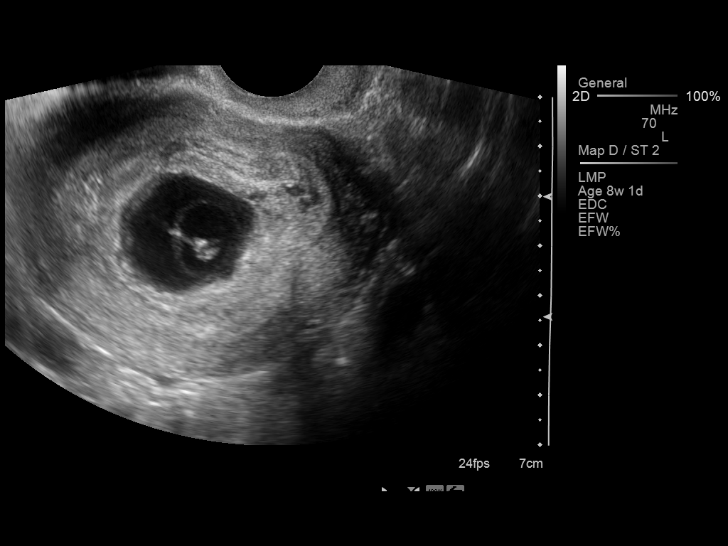
[im 24/35]
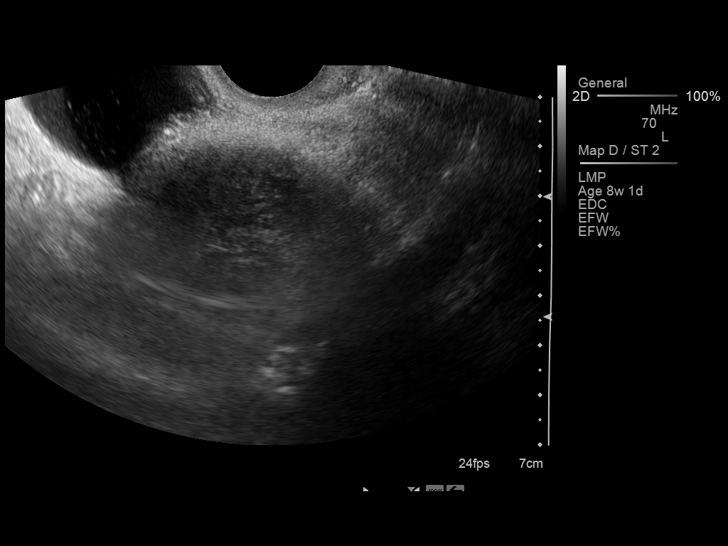
[im 27/35]
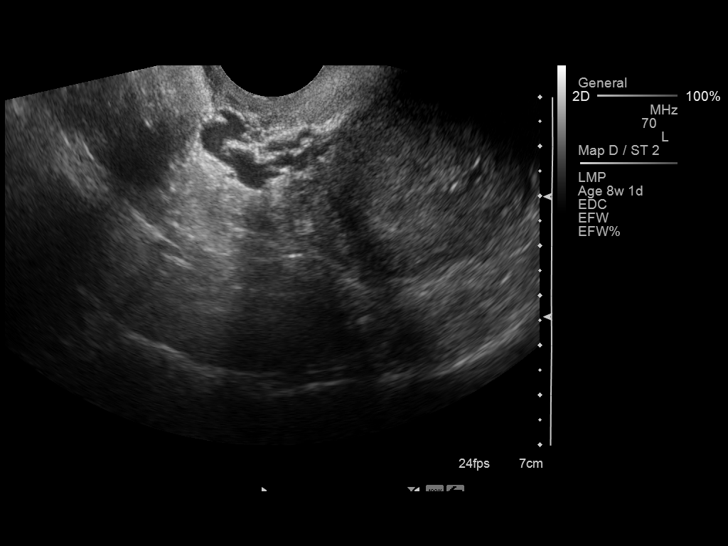
[im 29/35]
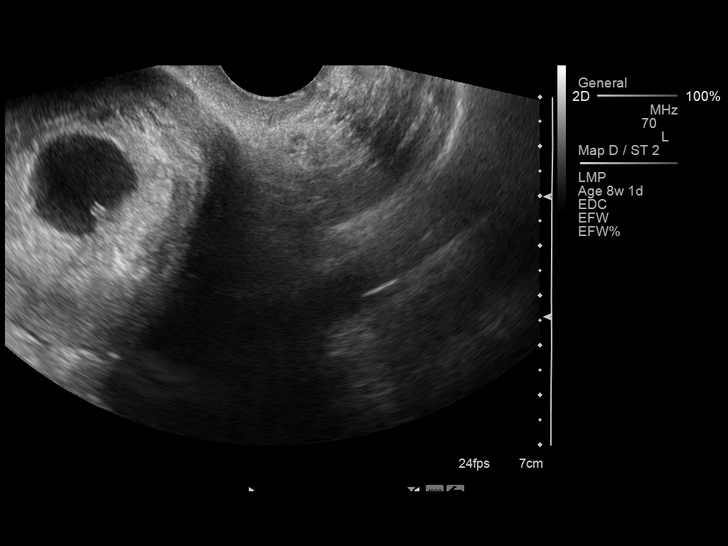
[im 32/35]
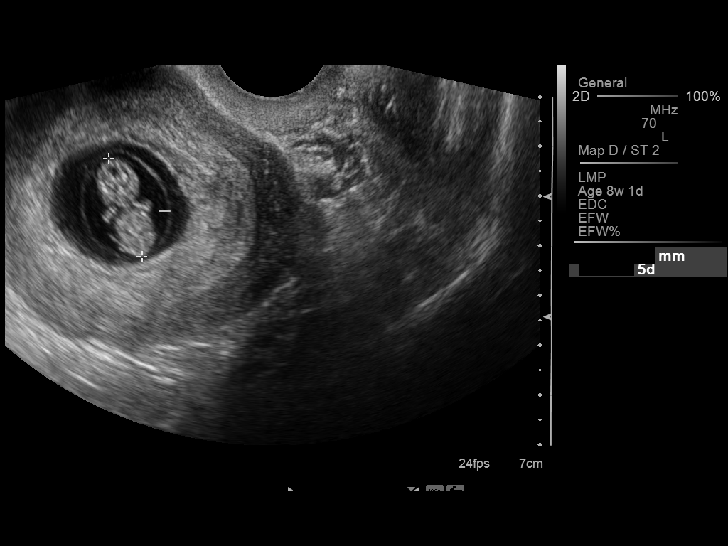
[im 35/35]
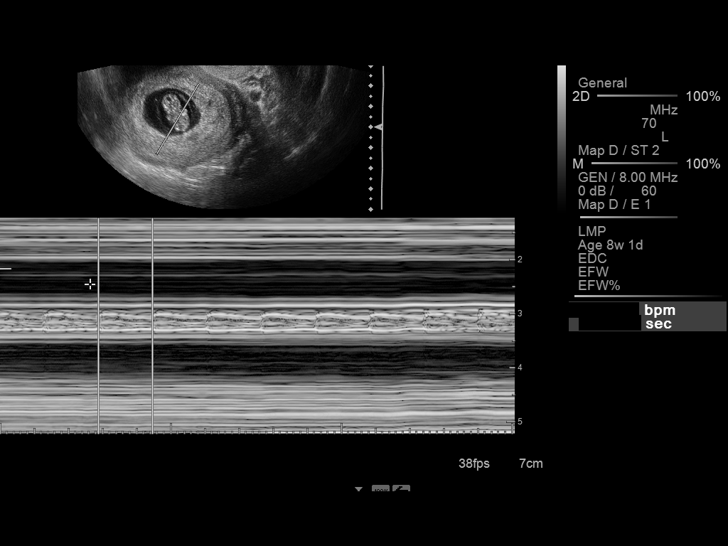

[14 of 28 positions shown; findings below may reference images not displayed]

Intrauterine gestational sac: Visualized/normal in shape.
Yolk sac: Yes
Embryo: Yes

Cardiac Activity: Yes
Heart Rate: 189 bpm

CRL:  21 mm  8w  5d           US EDC: 06/23/2012

Maternal uterus/Adnexae:
No subchorionic hemorrhage.  Ovaries are not identified.  No free
fluid.
IMPRESSION: Single intrauterine pregnancy of approximately 8 weeks 5 days
gestation.  No complicating features.

## 2013-12-01 ENCOUNTER — Telehealth: Payer: Self-pay

## 2014-04-30 NOTE — Telephone Encounter (Signed)
A user error has taken place: encounter opened in error, closed for administrative reasons.

## 2014-05-03 ENCOUNTER — Encounter (HOSPITAL_COMMUNITY): Payer: Self-pay | Admitting: Obstetrics and Gynecology

## 2016-11-01 ENCOUNTER — Encounter (HOSPITAL_BASED_OUTPATIENT_CLINIC_OR_DEPARTMENT_OTHER): Payer: Self-pay | Admitting: *Deleted

## 2016-11-01 ENCOUNTER — Emergency Department (HOSPITAL_BASED_OUTPATIENT_CLINIC_OR_DEPARTMENT_OTHER)
Admission: EM | Admit: 2016-11-01 | Discharge: 2016-11-01 | Disposition: A | Discharge status: Home / Self Care | Payer: Medicaid Other | Attending: Emergency Medicine | Admitting: Emergency Medicine

## 2016-11-01 DIAGNOSIS — I10 Essential (primary) hypertension: Secondary | ICD-10-CM | POA: Insufficient documentation

## 2016-11-01 DIAGNOSIS — R1013 Epigastric pain: Secondary | ICD-10-CM | POA: Insufficient documentation

## 2016-11-01 DIAGNOSIS — E119 Type 2 diabetes mellitus without complications: Secondary | ICD-10-CM | POA: Insufficient documentation

## 2016-11-01 DIAGNOSIS — Z79899 Other long term (current) drug therapy: Secondary | ICD-10-CM | POA: Insufficient documentation

## 2016-11-01 LAB — URINALYSIS, ROUTINE W REFLEX MICROSCOPIC
Bilirubin Urine: NEGATIVE
Glucose, UA: NEGATIVE mg/dL
Hgb urine dipstick: NEGATIVE
KETONES UR: NEGATIVE mg/dL
NITRITE: NEGATIVE
PH: 8 (ref 5.0–8.0)
PROTEIN: 30 mg/dL — AB
Specific Gravity, Urine: 1.027 (ref 1.005–1.030)

## 2016-11-01 LAB — PREGNANCY, URINE: PREG TEST UR: NEGATIVE

## 2016-11-01 LAB — URINALYSIS, MICROSCOPIC (REFLEX)

## 2016-11-01 MED ORDER — PANTOPRAZOLE SODIUM 40 MG PO TBEC
40.0000 mg | DELAYED_RELEASE_TABLET | Freq: Once | ORAL | Status: AC
  Administered 2016-11-01: 40 mg via ORAL
  Filled 2016-11-01: qty 1

## 2016-11-01 MED ORDER — OMEPRAZOLE 20 MG PO CPDR: 20.0000 mg | DELAYED_RELEASE_CAPSULE | Freq: Every day | ORAL | 0 refills | Status: DC

## 2016-11-01 MED ORDER — GI COCKTAIL ~~LOC~~
30.0000 mL | Freq: Once | ORAL | Status: AC
  Administered 2016-11-01: 30 mL via ORAL
  Filled 2016-11-01: qty 30

## 2016-11-01 NOTE — ED Notes (Signed)
c/o epigastric pain x 2 hours, chills and vomited x 2

## 2016-11-01 NOTE — ED Triage Notes (Signed)
Pt c/o epigastric abd pain  x 2 hrs

## 2016-11-01 NOTE — ED Notes (Signed)
Pt states she feels so much better 

## 2016-11-01 NOTE — ED Provider Notes (Signed)
[facility] Provider Note: Kim DellJ. Lane Dalary Hollar, MD, FACEP  CSN: [id number] MRN: [id number] ARRIVAL: 11/01/16 at 0024 ROOM: [facility]   CHIEF COMPLAINT  Abdominal Pain   HISTORY OF PRESENT ILLNESS  Kim Riddle is a 41 y.o. female with a history of GERD. She is not currently on any medications. She is here with about a 2 hour history of epigastric pain which she describes as feeling like previous GERD but more severe. She rated it as a 10 out of 10 at its worst. She vomited several times and now rates her pain as a 5 out of 10. The pain does not radiate. She continues to be nauseated.   Past Medical History:  Diagnosis Date  . Anxiety    no meds  . Diabetes mellitus    diet controlled, dx 3 yrs ago and then resolved  . Elevated blood pressure    no meds  . Gestational diabetes    diet controlled  . H/O varicella   . Headache(784.0)    migraines  . Hypertension    no meds  . Hypothyroid    dx with US    Past Surgical History:  Procedure Laterality Date  . CESAREAN SECTION  07/01/2012   Procedure: CESAREAN SECTION;  Surgeon: Loney LaurenceMichelle A Horvath, MD;  Location: [facility];  Service: Obstetrics;  Laterality: N/A;  Primary Cesarean Section Delivery Baby Boy @ [phone], Apgars 9/9  . CHOLECYSTECTOMY      Family History  Problem Relation Age of Onset  . Cancer Maternal Grandfather     liver  . Diabetes Maternal Uncle   . Hypertension Maternal Grandmother   . Diabetes Maternal Grandmother   . Cancer Paternal Grandmother     lung  . Heart disease Paternal Grandfather   . Anesthesia problems Neg Hx     Social History  Substance Use Topics  . Smoking status: Never Smoker  . Smokeless tobacco: Never Used  . Alcohol use No    Prior to Admission medications   Medication Sig Start Date End Date Taking? Authorizing Provider  docusate sodium (COLACE) 100 MG capsule Take 1 capsule (100 mg total) by mouth 2 (two) times daily. 07/02/12   Waynard ReedsKendra Ross, MD  ferrous  sulfate 325 (65 FE) MG tablet Take 1 tablet (325 mg total) by mouth 2 (two) times daily with a meal. 07/03/12   Philip AspenSidney Callahan, DO  ibuprofen (ADVIL,MOTRIN) 600 MG tablet Take 1 tablet (600 mg total) by mouth every 6 (six) hours as needed for pain. 07/02/12   Waynard ReedsKendra Ross, MD  oxyCODONE-acetaminophen (ROXICET) 5-325 MG per tablet Take 2 tablets by mouth every 4 (four) hours as needed for pain. May take 1-2 tablets every 4-6 hours as needed for pain 07/02/12   Waynard ReedsKendra Ross, MD    Allergies Clindamycin/lincomycin; Ondansetron; and Penicillins   REVIEW OF SYSTEMS  Negative except as noted here or in the History of Present Illness.   PHYSICAL EXAMINATION  Initial Vital Signs Blood pressure (!) 144/75, pulse 66, temperature 98.1 F (36.7 C), resp. rate 18, height 5\' 6"  (1.676 m), weight 210 lb (95.3 kg), last menstrual period 10/25/2016, SpO2 100 %, unknown if currently breastfeeding.  Examination General: Well-developed, well-nourished female in no acute distress; appearance consistent with age of record HENT: normocephalic; atraumatic Eyes: pupils equal, round and reactive to light; extraocular muscles intact Neck: supple Heart: regular rate and rhythm Lungs: clear to auscultation bilaterally Abdomen: soft; nondistended; mild epigastric tenderness; no masses or hepatosplenomegaly; bowel sounds present Extremities:  No deformity; full range of motion; pulses normal Neurologic: Awake, alert and oriented; motor function intact in all extremities and symmetric; no facial droop Skin: Warm and dry Psychiatric: Normal mood and affect   RESULTS  Summary of this visit's results, reviewed by myself:   EKG Interpretation  Date/Time:    Ventricular Rate:    PR Interval:    QRS Duration:   QT Interval:    QTC Calculation:   R Axis:     Text Interpretation:        Laboratory Studies: Results for orders placed or performed during the hospital encounter of 11/01/16 (from the past 24 hour(s))    Pregnancy, urine     Status: None   Collection Time: 11/01/16 12:30 AM  Result Value Ref Range   Preg Test, Ur NEGATIVE NEGATIVE  Urinalysis, Routine w reflex microscopic     Status: Abnormal   Collection Time: 11/01/16 12:30 AM  Result Value Ref Range   Color, Urine AMBER (A) YELLOW   APPearance CLOUDY (A) CLEAR   Specific Gravity, Urine 1.027 1.005 - 1.030   pH 8.0 5.0 - 8.0   Glucose, UA NEGATIVE NEGATIVE mg/dL   Hgb urine dipstick NEGATIVE NEGATIVE   Bilirubin Urine NEGATIVE NEGATIVE   Ketones, ur NEGATIVE NEGATIVE mg/dL   Protein, ur 30 (A) NEGATIVE mg/dL   Nitrite NEGATIVE NEGATIVE   Leukocytes, UA SMALL (A) NEGATIVE  Urinalysis, Microscopic (reflex)     Status: Abnormal   Collection Time: 11/01/16 12:30 AM  Result Value Ref Range   RBC / HPF 0-5 0 - 5 RBC/hpf   WBC, UA 0-5 0 - 5 WBC/hpf   Bacteria, UA MANY (A) NONE SEEN   Squamous Epithelial / LPF TOO NUMEROUS TO COUNT (A) NONE SEEN   Mucous PRESENT    Imaging Studies: No results found.  ED COURSE  Nursing notes and initial vitals signs, including pulse oximetry, reviewed.  Vitals:   11/01/16 0028 11/01/16 0030  BP:  (!) 144/75  Pulse:  66  Resp:  18  Temp:  98.1 F (36.7 C)  SpO2:  100%  Weight: 210 lb (95.3 kg)   Height: 5\' 6"  (1.676 m)    1:33 AM Significant relief with GI cocktail. We'll place on a PPI.  PROCEDURES    ED DIAGNOSES     ICD-9-CM ICD-10-CM   1. Epigastric pain 789.06 R10.13        Paula Libra, MD 11/01/16 [phone]

## 2018-12-15 ENCOUNTER — Ambulatory Visit: Payer: Self-pay | Admitting: Family Medicine

## 2019-04-13 LAB — HM PAP SMEAR: HM Pap smear: NEGATIVE

## 2021-04-18 LAB — HM MAMMOGRAPHY

## 2022-04-05 ENCOUNTER — Encounter: Payer: Self-pay | Admitting: Family Medicine

## 2022-04-05 ENCOUNTER — Ambulatory Visit (INDEPENDENT_AMBULATORY_CARE_PROVIDER_SITE_OTHER): Payer: BC Managed Care – PPO | Admitting: Family Medicine

## 2022-04-05 VITALS — BP 144/80 | HR 62 | Temp 97.9°F | Ht 65.5 in | Wt 244.1 lb

## 2022-04-05 DIAGNOSIS — D508 Other iron deficiency anemias: Secondary | ICD-10-CM | POA: Diagnosis not present

## 2022-04-05 DIAGNOSIS — E119 Type 2 diabetes mellitus without complications: Secondary | ICD-10-CM

## 2022-04-05 DIAGNOSIS — E039 Hypothyroidism, unspecified: Secondary | ICD-10-CM | POA: Diagnosis not present

## 2022-04-05 DIAGNOSIS — D509 Iron deficiency anemia, unspecified: Secondary | ICD-10-CM | POA: Insufficient documentation

## 2022-04-05 DIAGNOSIS — R002 Palpitations: Secondary | ICD-10-CM | POA: Diagnosis not present

## 2022-04-05 LAB — POCT GLYCOSYLATED HEMOGLOBIN (HGB A1C): Hemoglobin A1C: 5.8 % — AB (ref 4.0–5.6)

## 2022-04-05 LAB — CBC WITH DIFFERENTIAL/PLATELET
Basophils Absolute: 0 10*3/uL (ref 0.0–0.1)
Basophils Relative: 0.6 % (ref 0.0–3.0)
Eosinophils Absolute: 0.1 10*3/uL (ref 0.0–0.7)
Eosinophils Relative: 1.5 % (ref 0.0–5.0)
HCT: 40.2 % (ref 36.0–46.0)
Hemoglobin: 13.1 g/dL (ref 12.0–15.0)
Lymphocytes Relative: 32.5 % (ref 12.0–46.0)
Lymphs Abs: 2.6 10*3/uL (ref 0.7–4.0)
MCHC: 32.6 g/dL (ref 30.0–36.0)
MCV: 72.8 fl — ABNORMAL LOW (ref 78.0–100.0)
Monocytes Absolute: 0.6 10*3/uL (ref 0.1–1.0)
Monocytes Relative: 8 % (ref 3.0–12.0)
Neutro Abs: 4.6 10*3/uL (ref 1.4–7.7)
Neutrophils Relative %: 57.4 % (ref 43.0–77.0)
Platelets: 220 10*3/uL (ref 150.0–400.0)
RBC: 5.51 Mil/uL — ABNORMAL HIGH (ref 3.87–5.11)
RDW: 18.1 % — ABNORMAL HIGH (ref 11.5–15.5)
WBC: 7.9 10*3/uL (ref 4.0–10.5)

## 2022-04-05 LAB — COMPREHENSIVE METABOLIC PANEL
ALT: 23 U/L (ref 0–35)
AST: 27 U/L (ref 0–37)
Albumin: 4.5 g/dL (ref 3.5–5.2)
Alkaline Phosphatase: 57 U/L (ref 39–117)
BUN: 5 mg/dL — ABNORMAL LOW (ref 6–23)
CO2: 27 mEq/L (ref 19–32)
Calcium: 9.6 mg/dL (ref 8.4–10.5)
Chloride: 100 mEq/L (ref 96–112)
Creatinine, Ser: 0.67 mg/dL (ref 0.40–1.20)
GFR: 106.77 mL/min (ref >60.00)
Glucose, Bld: 111 mg/dL — ABNORMAL HIGH (ref 70–99)
Potassium: 3.7 mEq/L (ref 3.5–5.1)
Sodium: 134 mEq/L — ABNORMAL LOW (ref 135–145)
Total Bilirubin: 0.6 mg/dL (ref 0.2–1.2)
Total Protein: 8.8 g/dL — ABNORMAL HIGH (ref 6.0–8.3)

## 2022-04-05 LAB — TSH: TSH: 3.34 u[IU]/mL (ref 0.35–5.50)

## 2022-04-05 MED ORDER — MOUNJARO 7.5 MG/0.5ML ~~LOC~~ SOAJ: 7.5000 mg | SUBCUTANEOUS | 5 refills | Status: DC

## 2022-04-05 NOTE — Assessment & Plan Note (Addendum)
Reported by patient during the history, she reports she has had to have an ultrasound in the past with biopsy, states this was done in [place], will recheck TSH today due to the palpitations and obtain records from her previous PCP.

## 2022-04-05 NOTE — Assessment & Plan Note (Signed)
A1C performed in office today and is 5.8, well controlled. Pt states she does have chronic nausea due to the [facility], however she continues to take the 7.5 mg weekly dose. She reports that her weight stable, however she would like to discuss a weight loss plan. I will see her back in 3 months after the palpitations work up is complete.

## 2022-04-05 NOTE — Assessment & Plan Note (Addendum)
3 month history of intermittent random palpitations, it could be due to her anemia, however cardiac causes should be ruled out first. EKG performed in office today and was interpreted by me. It shows normal sinus rhythm without any ST or T wave abnormalities, I recommended she wear a 30 day event monitor at home to try and detect any cardiac arrythmias. Will follow up after the results are known.

## 2022-04-05 NOTE — Assessment & Plan Note (Signed)
Patient reports a history of this, I will ask for her records from her previous PCP and check new labs today. Further orders TBD based on the results of her bloodwork.

## 2022-04-05 NOTE — Progress Notes (Signed)
New Patient Office Visit  Subjective    Patient ID: Kim Riddle, female    DOB: 1976-01-08  Age: 46 y.o. MRN: [id number]  CC:  Chief Complaint  Patient presents with   Establish Care   Palpitations    Patient complains of intermittent episodes of palpitations x3 months, noticed once while driving, states she had to pull the car over as the heart was racing    HPI Kim Riddle presents to establish care Patient is reporting some heart palpitations this summer. States that she was driving the first time it happened and she suddenly became confused and felt like her heart was stopping and restarting. States the episode lasted for about 40 seconds then resolved. Reports that it has been happening recurrently every couple of weeks, states she felt a little bit of pain in the left side of her chest. Sometimes she feels a little SOB. States they are very random in occurrence, can be just sitting on the couch and they will happen.   Has a history of diabetes and takes Mounjaro weekly, states this medication controls her blood sugars well. She was told by her previous provider that she was anemic however she cannot tolerate the iron supplements due to nausea, was told that she needed to get the iron infusions done.  EKG was performed in the office today and I reviewed this with the patient.   Outpatient Encounter Medications as of 04/05/2022  Medication Sig   [DISCONTINUED] MOUNJARO 7.5 MG/0.5ML Pen Inject into the skin.   MOUNJARO 7.5 MG/0.5ML Pen Inject 7.5 mg into the skin once a week.   [DISCONTINUED] omeprazole (PRILOSEC) 20 MG capsule Take 1 capsule (20 mg total) by mouth daily.   No facility-administered encounter medications on file as of 04/05/2022.    Past Medical History:  Diagnosis Date   Anxiety    no meds   Diabetes mellitus    diet controlled, dx 3 yrs ago and then resolved   Elevated blood pressure    no meds   Gestational diabetes    diet controlled    H/O varicella    Headache(784.0)    migraines   Hypertension    no meds   Hypothyroid    dx with [place]    Past Surgical History:  Procedure Laterality Date   CESAREAN SECTION  07/01/2012   Procedure: CESAREAN SECTION;  Surgeon: Loney Laurence, MD;  Location: [facility];  Service: Obstetrics;  Laterality: N/A;  Primary Cesarean Section Delivery Baby Boy @ [phone], Apgars 9/9   CHOLECYSTECTOMY     TONSILLECTOMY     age 40    Family History  Problem Relation Age of Onset   Hyperlipidemia Mother    Hypertension Mother    Arthritis Mother    Hypertension Father    Aneurysm Father    Hyperthyroidism Father    Diabetes Maternal Uncle    Hypertension Maternal Grandmother    Diabetes Maternal Grandmother    Heart attack Maternal Grandmother    Cancer Maternal Grandfather        liver   Cancer Paternal Grandmother        lung   Heart disease Paternal Grandfather    Anesthesia problems Neg Hx     Social History   Socioeconomic History   Marital status: Married    Spouse name: Not on file   Number of children: Not on file   Years of education: Not on file   Highest education level: Not on file  Occupational History   Not on file  Tobacco Use   Smoking status: Never   Smokeless tobacco: Never  Vaping Use   Vaping Use: Never used  Substance and Sexual Activity   Alcohol use: Never   Drug use: Never   Sexual activity: Yes    Birth control/protection: None  Other Topics Concern   Not on file  Social History Narrative   Not on file   Social Determinants of Health   Financial Resource Strain: Not on file  Food Insecurity: Not on file  Transportation Needs: Not on file  Physical Activity: Not on file  Stress: Not on file  Social Connections: Not on file  Intimate Partner Violence: Not on file    Review of Systems  Constitutional:  Negative for chills, fever and malaise/fatigue.  HENT:  Negative for hearing loss.   Eyes:  Negative for blurred vision.   Respiratory:  Positive for shortness of breath.   Cardiovascular:  Positive for chest pain. Negative for leg swelling.  Gastrointestinal:  Positive for nausea.  Neurological:  Positive for dizziness. Negative for focal weakness, seizures, loss of consciousness and headaches.  All other systems reviewed and are negative.       Objective    BP (!) 144/80 (BP Location: Left Arm, Patient Position: Sitting, Cuff Size: Large)   Pulse 62   Temp 97.9 F (36.6 C) (Oral)   Ht 5' 5.5" (1.664 m)   Wt 244 lb 1.6 oz (110.7 kg)   LMP 03/26/2022 (Exact Date)   BMI 40.00 kg/m   Physical Exam Vitals reviewed.  Constitutional:      Appearance: Normal appearance. She is well-groomed. She is obese.  Eyes:     Conjunctiva/sclera: Conjunctivae normal.  Neck:     Thyroid: No thyromegaly.  Cardiovascular:     Rate and Rhythm: Normal rate and regular rhythm.     Pulses: Normal pulses.     Heart sounds: S1 normal and S2 normal.  Pulmonary:     Effort: Pulmonary effort is normal.     Breath sounds: Normal breath sounds and air entry.  Abdominal:     General: Bowel sounds are normal.     Palpations: Abdomen is soft.  Musculoskeletal:        General: Normal range of motion.     Cervical back: Normal range of motion and neck supple.     Right lower leg: No edema.     Left lower leg: No edema.  Skin:    General: Skin is warm and dry.  Neurological:     Mental Status: She is alert and oriented to person, place, and time. Mental status is at baseline.     Gait: Gait is intact.  Psychiatric:        Mood and Affect: Mood and affect normal.        Speech: Speech normal.        Behavior: Behavior normal.        Judgment: Judgment normal.     Last hemoglobin A1c Lab Results  Component Value Date   HGBA1C 5.8 (A) 04/05/2022        Assessment & Plan:   Problem List Items Addressed This Visit       Endocrine   Controlled type 2 diabetes mellitus without complication, without long-term  current use of insulin (HCC)    A1C performed in office today and is 5.8, well controlled. Pt states she does have chronic nausea due to the [facility], however she  continues to take the 7.5 mg weekly dose. She reports that her weight stable, however she would like to discuss a weight loss plan. I will see her back in 3 months after the palpitations work up is complete.      Relevant Medications   MOUNJARO 7.5 MG/0.5ML Pen   Hypothyroidism (acquired)    Reported by patient during the history, she reports she has had to have an ultrasound in the past with biopsy, states this was done in [place], will recheck TSH today due to the palpitations and obtain records from her previous PCP.        Other   Heart palpitations    3 month history of intermittent random palpitations, it could be due to her anemia, however cardiac causes should be ruled out first. EKG performed in office today and was interpreted by me. It shows normal sinus rhythm without any ST or T wave abnormalities, I recommended she wear a 30 day event monitor at home to try and detect any cardiac arrythmias. Will follow up after the results are known.      Relevant Orders   Cardiac event monitor   EKG 12-Lead (Completed)   TSH   CBC with Differential/Platelets   CMP   Iron, TIBC and Ferritin Panel   Iron deficiency anemia (Chronic)    Patient reports a history of this, I will ask for her records from her previous PCP and check new labs today. Further orders TBD based on the results of her bloodwork.      Other Visit Diagnoses     Diabetes mellitus without complication (HCC)    -  Primary   Relevant Medications   MOUNJARO 7.5 MG/0.5ML Pen   Other Relevant Orders   POC HgB A1c (Completed)       Return in about 6 months (around 10/05/2022).   Karie Georges, MD

## 2022-04-06 LAB — IRON,TIBC AND FERRITIN PANEL
%SAT: 12 % (calc) — ABNORMAL LOW (ref 16–45)
Ferritin: 7 ng/mL — ABNORMAL LOW (ref 16–232)
Iron: 48 ug/dL (ref 40–190)
TIBC: 404 mcg/dL (calc) (ref 250–450)

## 2022-04-09 ENCOUNTER — Telehealth: Payer: Self-pay | Admitting: Family Medicine

## 2022-04-09 NOTE — Telephone Encounter (Signed)
Pt called to say she just left the  [facility], [address] AT [address] [place] Phone:  [phone]  Fax:  [phone]      She tried to pick up her Rx for: MOUNJARO 7.5 MG/0.5ML Pen and even though the Rx has a start date of 04/05/22, they are telling her she cannot have it until November. Pt is asking for any assistance possible.  Please advise.

## 2022-04-09 NOTE — Telephone Encounter (Signed)
Spoke with Aldona Bar at [facility] for assistance as below.  Aldona Bar stated a message was received stating it is too early to fill the Rx due to insurance and she informed the patient of this also.  I called the patient,  advised she call her insurance company as above and she agreed.

## 2022-04-20 ENCOUNTER — Ambulatory Visit: Payer: BC Managed Care – PPO | Attending: Family Medicine

## 2022-04-20 DIAGNOSIS — R002 Palpitations: Secondary | ICD-10-CM | POA: Diagnosis not present

## 2022-05-23 ENCOUNTER — Encounter: Payer: Self-pay | Admitting: Family Medicine

## 2022-05-23 NOTE — Telephone Encounter (Signed)
Results are not available for approximately 2-3 weeks until after the patient sends the monitor back in.

## 2022-05-23 NOTE — Telephone Encounter (Signed)
Did I get the results of the heart monitor? I don't see it in the computer

## 2022-05-28 NOTE — Progress Notes (Signed)
Normal cardiac monitor report.

## 2022-06-15 ENCOUNTER — Other Ambulatory Visit: Payer: Self-pay | Admitting: Family Medicine

## 2022-06-15 ENCOUNTER — Telehealth: Payer: Self-pay | Admitting: Family Medicine

## 2022-06-15 DIAGNOSIS — E119 Type 2 diabetes mellitus without complications: Secondary | ICD-10-CM

## 2022-06-15 MED ORDER — TIRZEPATIDE 5 MG/0.5ML ~~LOC~~ SOAJ: 5.0000 mg | SUBCUTANEOUS | 5 refills | Status: DC

## 2022-06-15 NOTE — Telephone Encounter (Signed)
Patient notified of update  and verbalized understanding. 

## 2022-06-15 NOTE — Telephone Encounter (Signed)
Patient returned call

## 2022-06-15 NOTE — Telephone Encounter (Signed)
Pt called to inform MD that the 7.5 mg of the Greggory Keen is making her very nauseous and she has been vomiting for the last 4 days.  Pt is asking that MD lower the dosage to see if that will help (preferably to 5 mg)  Please send new Rx to  [id number] [place], [place] - [address] AT [facility] [address] [facility] [address] Phone: [phone]  Fax: [phone]

## 2022-06-15 NOTE — Telephone Encounter (Signed)
Please advise 

## 2022-06-15 NOTE — Telephone Encounter (Signed)
Left message to return phone call.

## 2022-06-15 NOTE — Telephone Encounter (Signed)
Mounjaro 5mg sent.

## 2022-07-12 ENCOUNTER — Other Ambulatory Visit: Payer: Self-pay | Admitting: Obstetrics and Gynecology

## 2022-07-12 DIAGNOSIS — R928 Other abnormal and inconclusive findings on diagnostic imaging of breast: Secondary | ICD-10-CM

## 2022-07-20 ENCOUNTER — Ambulatory Visit
Admission: RE | Admit: 2022-07-20 | Discharge: 2022-07-20 | Disposition: A | Discharge status: Home / Self Care | Payer: BC Managed Care – PPO | Source: Ambulatory Visit | Attending: Obstetrics and Gynecology | Admitting: Obstetrics and Gynecology

## 2022-07-20 ENCOUNTER — Other Ambulatory Visit: Payer: Self-pay | Admitting: Obstetrics and Gynecology

## 2022-07-20 DIAGNOSIS — R928 Other abnormal and inconclusive findings on diagnostic imaging of breast: Secondary | ICD-10-CM

## 2022-07-20 DIAGNOSIS — N6320 Unspecified lump in the left breast, unspecified quadrant: Secondary | ICD-10-CM

## 2022-07-31 ENCOUNTER — Telehealth: Payer: Self-pay | Admitting: Family Medicine

## 2022-07-31 ENCOUNTER — Encounter: Payer: Self-pay | Admitting: Family Medicine

## 2022-07-31 ENCOUNTER — Ambulatory Visit (INDEPENDENT_AMBULATORY_CARE_PROVIDER_SITE_OTHER): Payer: BC Managed Care – PPO | Admitting: Family Medicine

## 2022-07-31 VITALS — BP 132/84 | HR 62 | Temp 98.2°F | Ht 65.0 in | Wt 243.1 lb

## 2022-07-31 DIAGNOSIS — Z0000 Encounter for general adult medical examination without abnormal findings: Secondary | ICD-10-CM

## 2022-07-31 DIAGNOSIS — F5104 Psychophysiologic insomnia: Secondary | ICD-10-CM | POA: Diagnosis not present

## 2022-07-31 DIAGNOSIS — E119 Type 2 diabetes mellitus without complications: Secondary | ICD-10-CM | POA: Diagnosis not present

## 2022-07-31 DIAGNOSIS — D509 Iron deficiency anemia, unspecified: Secondary | ICD-10-CM

## 2022-07-31 LAB — LIPID PANEL
Cholesterol: 193 mg/dL (ref 0–200)
HDL: 57.8 mg/dL (ref >39.00)
LDL Cholesterol: 115 mg/dL — ABNORMAL HIGH (ref 0–99)
NonHDL: 135.08
Total CHOL/HDL Ratio: 3
Triglycerides: 98 mg/dL (ref 0.0–149.0)
VLDL: 19.6 mg/dL (ref 0.0–40.0)

## 2022-07-31 LAB — COMPREHENSIVE METABOLIC PANEL
ALT: 17 U/L (ref 0–35)
AST: 20 U/L (ref 0–37)
Albumin: 4.2 g/dL (ref 3.5–5.2)
Alkaline Phosphatase: 53 U/L (ref 39–117)
BUN: 8 mg/dL (ref 6–23)
CO2: 28 mEq/L (ref 19–32)
Calcium: 9.3 mg/dL (ref 8.4–10.5)
Chloride: 100 mEq/L (ref 96–112)
Creatinine, Ser: 0.66 mg/dL (ref 0.40–1.20)
GFR: 106.92 mL/min (ref >60.00)
Glucose, Bld: 116 mg/dL — ABNORMAL HIGH (ref 70–99)
Potassium: 4.1 mEq/L (ref 3.5–5.1)
Sodium: 135 mEq/L (ref 135–145)
Total Bilirubin: 0.7 mg/dL (ref 0.2–1.2)
Total Protein: 8 g/dL (ref 6.0–8.3)

## 2022-07-31 LAB — MICROALBUMIN / CREATININE URINE RATIO
Creatinine,U: 229.2 mg/dL
Microalb Creat Ratio: 1.4 mg/g (ref 0.0–30.0)
Microalb, Ur: 3.2 mg/dL — ABNORMAL HIGH (ref 0.0–1.9)

## 2022-07-31 LAB — POCT GLYCOSYLATED HEMOGLOBIN (HGB A1C): Hemoglobin A1C: 6.2 % — AB (ref 4.0–5.6)

## 2022-07-31 MED ORDER — FERROUS SULFATE 300 (60 FE) MG/5ML PO SOLN: 300.0000 mg | Freq: Every day | ORAL | 3 refills | Status: DC

## 2022-07-31 MED ORDER — AMITRIPTYLINE HCL 25 MG PO TABS: 25.0000 mg | ORAL_TABLET | Freq: Every evening | ORAL | 2 refills | Status: DC | PRN

## 2022-07-31 NOTE — Telephone Encounter (Signed)
ferrous sulfate 300 (60 Fe) MG/5ML syrup only have in 220 mg/23ml and in elixir form which contains alcohol. Says they can switch the type and dispense the equivalent. Please advise

## 2022-07-31 NOTE — Telephone Encounter (Signed)
Pt called to request a call back to go over her lab results.

## 2022-07-31 NOTE — Assessment & Plan Note (Signed)
Pt cannot tolerate the ferrous sulfate pills, will try the ferrous sulfate syrup, 300 mg daily.

## 2022-07-31 NOTE — Assessment & Plan Note (Signed)
On mounjaro 5 mg weekly, states that the 7.5 mg caused too much nausea so she went back down. Will continue this medication and check A1C, CMP and lipid panel. Foot exam peformed today and is WNL.

## 2022-07-31 NOTE — Telephone Encounter (Signed)
Spoke with Saralyn Pilar and informed him of the message below.

## 2022-07-31 NOTE — Telephone Encounter (Signed)
Ok to switch the type

## 2022-07-31 NOTE — Patient Instructions (Signed)

## 2022-07-31 NOTE — Progress Notes (Signed)
Complete physical exam  Patient: Kim Riddle   DOB: Feb 28, 1976   47 y.o. Female  MRN: [id number]  Subjective:    Chief Complaint  Patient presents with   Annual Exam    Kim Riddle is a 47 y.o. female who presents today for a complete physical exam. She reports consuming a general diet. The patient does not participate in regular exercise at present. She generally feels well. She reports sleeping poorly. She does have additional problems to discuss today.   Patient is reporting chronic insomnia. She states that she has tried melatonin, trazodone and unisom in the past without any improvement. States the trazodone gave her nightmares and didn't help with her insomnia.   Most recent fall risk assessment:    04/05/2022   10:29 AM  [place] in the past year? 0  Number falls in past yr: 0  Injury with Fall? 0     Most recent depression screenings:    07/31/2022    8:09 AM 04/05/2022   10:28 AM  PHQ 2/9 Scores  PHQ - 2 Score 2 2  PHQ- 9 Score 9 11    Vision:Within last year and Dental: No current dental problems and Receives regular dental care  Patient Active Problem List   Diagnosis Date Noted   Controlled type 2 diabetes mellitus without complication, without long-term current use of insulin ([place]) 04/05/2022   Heart palpitations 04/05/2022   Iron deficiency anemia 04/05/2022   Hypothyroidism (acquired) 04/05/2022   Nausea/vomiting in pregnancy 12/10/2011   Acid reflux 12/10/2011      Patient Care Team: Farrel Conners, MD as PCP - General (Family Medicine)   Outpatient Medications Prior to Visit  Medication Sig   tirzepatide [facility]) 5 MG/0.5ML Pen Inject 5 mg into the skin once a week.   No facility-administered medications prior to visit.    Review of Systems  HENT:  Negative for hearing loss.   Eyes:  Negative for blurred vision.  Respiratory:  Negative for shortness of breath.   Cardiovascular:  Negative for chest pain.   Gastrointestinal: Negative.   Genitourinary: Negative.   Musculoskeletal:  Negative for back pain.  Neurological:  Negative for headaches.  Psychiatric/Behavioral:  Negative for depression.           Objective:     BP 132/84 (BP Location: Left Arm, Patient Position: Sitting, Cuff Size: Large)   Pulse 62   Temp 98.2 F (36.8 C) (Oral)   Ht 5\' 5"  (1.651 m)   Wt 243 lb 1.6 oz (110.3 kg)   LMP 07/02/2022   SpO2 98%   BMI 40.45 kg/m    Physical Exam Vitals reviewed.  Constitutional:      Appearance: Normal appearance. She is well-groomed. She is obese.  HENT:     Right Ear: Tympanic membrane and ear canal normal.     Left Ear: Tympanic membrane and ear canal normal.     Mouth/Throat:     Mouth: Mucous membranes are moist.     Pharynx: No posterior oropharyngeal erythema.  Eyes:     Conjunctiva/sclera: Conjunctivae normal.  Neck:     Thyroid: No thyromegaly.  Cardiovascular:     Rate and Rhythm: Normal rate and regular rhythm.     Pulses: Normal pulses.          Dorsalis pedis pulses are 2+ on the right side and 2+ on the left side.       Posterior tibial pulses are 2+ on  the right side and 2+ on the left side.     Heart sounds: S1 normal and S2 normal.  Pulmonary:     Effort: Pulmonary effort is normal.     Breath sounds: Normal breath sounds and air entry.  Abdominal:     General: Bowel sounds are normal.     Palpations: Abdomen is soft.  Musculoskeletal:        General: Normal range of motion.     Cervical back: Normal range of motion and neck supple.     Right lower leg: No edema.     Left lower leg: No edema.  Feet:     Right foot:     Protective Sensation: 3 sites tested.  3 sites sensed.     Skin integrity: Skin integrity normal.     Left foot:     Protective Sensation: 3 sites tested.  3 sites sensed.     Skin integrity: Skin integrity normal.  Skin:    General: Skin is warm and dry.  Neurological:     Mental Status: She is alert and oriented to  person, place, and time. Mental status is at baseline.     Gait: Gait is intact.  Psychiatric:        Mood and Affect: Mood and affect normal.        Speech: Speech normal.        Behavior: Behavior normal.        Judgment: Judgment normal.         Assessment & Plan:    Routine Health Maintenance and Physical Exam  Immunization History  Administered Date(s) Administered   Moderna Sars-Covid-2 Vaccination 11/05/2019, 01/05/2020   Tdap 07/02/2012    Health Maintenance  Topic Date Due   FOOT EXAM  Never done   OPHTHALMOLOGY EXAM  Never done   DTaP/Tdap/Td (2 - Td or Tdap) 07/02/2022   COVID-19 Vaccine (3 - 2023-24 season) 08/16/2022 (Originally 03/02/2022)   INFLUENZA VACCINE  10/01/2022 (Originally 01/30/2022)   Hepatitis C Screening  04/06/2023 (Originally 06/20/1996)   HEMOGLOBIN A1C  01/29/2023   Diabetic kidney evaluation - eGFR measurement  08/01/2023   Diabetic kidney evaluation - Urine ACR  08/01/2023   PAP SMEAR-Modifier  05/02/2024   HIV Screening  Completed   HPV VACCINES  Aged Out    Discussed health benefits of physical activity, and encouraged her to engage in regular exercise appropriate for her age and condition.  Problem List Items Addressed This Visit       Unprioritized   Controlled type 2 diabetes mellitus without complication, without long-term current use of insulin ([employer]) - Primary    On mounjaro 5 mg weekly, states that the 7.5 mg caused too much nausea so she went back down. Will continue this medication and check A1C, CMP and lipid panel. Foot exam peformed today and is WNL.      Relevant Orders   POC HgB A1c (Completed)   Microalbumin/Creatinine Ratio, Urine (Completed)   CMP (Completed)   Lipid Panel (Completed)   Iron deficiency anemia (Chronic)    Pt cannot tolerate the ferrous sulfate pills, will try the ferrous sulfate syrup, 300 mg daily.       Relevant Medications   ferrous sulfate 300 (60 Fe) MG/5ML syrup   Other Visit Diagnoses      Chronic insomnia       Relevant Medications   Adverse reaction to trazodone, OTC medications are not effective, will treat with amitriptyline 25 mg at  bedtime PRN. amitriptyline (ELAVIL) 25 MG tablet   Routine general medical examination at a health care facility      Normal physical exam findings today.     Return in about 6 months (around 01/29/2023) for DM.     Farrel Conners, MD

## 2022-08-01 NOTE — Telephone Encounter (Signed)
Noted  

## 2022-08-01 NOTE — Telephone Encounter (Signed)
Reviewed labs verbally with patient. Ok to close task

## 2022-08-09 ENCOUNTER — Encounter: Payer: Self-pay | Admitting: Family Medicine

## 2022-08-09 DIAGNOSIS — F5104 Psychophysiologic insomnia: Secondary | ICD-10-CM

## 2022-08-09 MED ORDER — RAMELTEON 8 MG PO TABS: 8.0000 mg | ORAL_TABLET | Freq: Every day | ORAL | 2 refills | Status: DC

## 2022-10-29 ENCOUNTER — Encounter: Payer: Self-pay | Admitting: Family Medicine

## 2022-10-29 DIAGNOSIS — E119 Type 2 diabetes mellitus without complications: Secondary | ICD-10-CM

## 2022-10-31 MED ORDER — OZEMPIC (0.25 OR 0.5 MG/DOSE) 2 MG/3ML ~~LOC~~ SOPN: 0.2500 mg | PEN_INJECTOR | SUBCUTANEOUS | 5 refills | Status: DC

## 2022-11-06 MED ORDER — TIRZEPATIDE 2.5 MG/0.5ML ~~LOC~~ SOAJ: 2.5000 mg | SUBCUTANEOUS | 5 refills | Status: DC

## 2022-11-18 ENCOUNTER — Other Ambulatory Visit: Payer: Self-pay | Admitting: Family Medicine

## 2022-11-18 DIAGNOSIS — F5104 Psychophysiologic insomnia: Secondary | ICD-10-CM

## 2022-11-21 NOTE — Telephone Encounter (Signed)
Left a detailed message with the question below at the patient's cell number.

## 2022-11-21 NOTE — Telephone Encounter (Signed)
Can we ask the patient if she is still taking it? If so then ok to refill

## 2022-11-27 ENCOUNTER — Other Ambulatory Visit (HOSPITAL_COMMUNITY): Payer: Self-pay

## 2022-11-29 ENCOUNTER — Telehealth: Payer: Self-pay

## 2022-11-29 DIAGNOSIS — F5104 Psychophysiologic insomnia: Secondary | ICD-10-CM

## 2022-11-29 NOTE — Telephone Encounter (Signed)
PA initiated via Covermymeds; KEY: [id number]. Awaiting determination

## 2022-12-03 NOTE — Telephone Encounter (Signed)
Left a detailed message with the question below at the patient's cell number. 

## 2022-12-04 NOTE — Telephone Encounter (Signed)
PA denied.   Details of this decision are provided on the physician outcome notice which has been faxed to the number on file. 

## 2022-12-06 NOTE — Telephone Encounter (Signed)
Patient stated she does take the medication occasionally and does not feel this helps with sleep.  Message sent to PCP.

## 2022-12-06 NOTE — Telephone Encounter (Signed)
Spoke with the patient and informed her of the message below.  Patient stated she would prefer to have a referral to sleep medicine and is aware someone will contact her with appt info.  Message sent to PCP.

## 2022-12-06 NOTE — Telephone Encounter (Signed)
Ok, I'm not sure what else we could prescribe, usually I recommend referral to sleep medicine or to psychiatry for evaluation due to the chronic nature of the insomnia-- please ask patient if she would be interested in either of those referrals.

## 2022-12-07 NOTE — Telephone Encounter (Signed)
Referral placed.

## 2022-12-07 NOTE — Addendum Note (Signed)
Addended by: Karie Georges on: 12/07/2022 11:17 AM   Modules accepted: Orders

## 2022-12-31 ENCOUNTER — Encounter: Payer: Self-pay | Admitting: Family Medicine

## 2022-12-31 DIAGNOSIS — F5104 Psychophysiologic insomnia: Secondary | ICD-10-CM

## 2022-12-31 MED ORDER — AMITRIPTYLINE HCL 50 MG PO TABS
50.0000 mg | ORAL_TABLET | Freq: Every day | ORAL | 1 refills | Status: DC
Start: 2022-12-31 — End: 2024-04-16

## 2023-01-02 ENCOUNTER — Other Ambulatory Visit (HOSPITAL_COMMUNITY): Payer: Self-pay

## 2023-01-03 ENCOUNTER — Other Ambulatory Visit (HOSPITAL_COMMUNITY): Payer: Self-pay

## 2023-01-04 ENCOUNTER — Other Ambulatory Visit (HOSPITAL_COMMUNITY): Payer: Self-pay

## 2023-01-09 ENCOUNTER — Other Ambulatory Visit (HOSPITAL_COMMUNITY): Payer: Self-pay

## 2023-01-09 ENCOUNTER — Telehealth: Payer: Self-pay | Admitting: Pharmacy Technician

## 2023-01-09 ENCOUNTER — Telehealth: Payer: Self-pay | Admitting: Family Medicine

## 2023-01-09 NOTE — Telephone Encounter (Signed)
See My chart message

## 2023-01-09 NOTE — Telephone Encounter (Signed)
Pt called, returning CMA's call. CMA was with a patient Pt asked that CMA call back at her earliest convenience. 

## 2023-01-09 NOTE — Telephone Encounter (Signed)
Pharmacy Patient Advocate Encounter   Received notification from CoverMyMeds that prior authorization for Mounjaro 2.5MG /0.5ML pen-injectors is required/requested.   Insurance verification completed.   The patient is insured through [facility]  .   Per test claim:   PA submitted to [facility] via CoverMyMeds Key/confirmation #/EOC [id number] Status is pending

## 2023-01-15 NOTE — Telephone Encounter (Signed)
Ok please let patient know, if she has any documentation of her diagnosis of an A1C >6.5 please have her send [place] a copy, like from the physician who originally put her on the medication, then we can do an appeal

## 2023-01-15 NOTE — Telephone Encounter (Signed)
Pharmacy Patient Advocate Encounter  Received notification from  [employer]  that Prior Authorization for Kim Riddle has been DENIED because no documentation confirming type 2 diabetes (HgA1c greater than 6.5%) .Marland Kitchen  PA #/Case ID/Reference #: [id number]  Please be advised we currently do not have a Pharmacist to review denials, therefore you will need to process appeals accordingly as needed. Thanks for your support at this time. Contact for appeals are as follows: Phone: [phone], Fax: [phone]  Denial letter attached to charts

## 2023-01-15 NOTE — Telephone Encounter (Signed)
I left a detailed message with the information below at the patient's cell number.  Message also left stating unfortunately if she finds records from another office, she would need to fax these to the insurance company number below as we are not able to send records from another office.

## 2023-01-21 ENCOUNTER — Ambulatory Visit
Admission: RE | Admit: 2023-01-21 | Discharge: 2023-01-21 | Disposition: A | Discharge status: Home / Self Care | Payer: BC Managed Care – PPO | Source: Ambulatory Visit | Attending: Obstetrics and Gynecology | Admitting: Obstetrics and Gynecology

## 2023-01-21 ENCOUNTER — Other Ambulatory Visit: Payer: Self-pay | Admitting: Obstetrics and Gynecology

## 2023-01-21 ENCOUNTER — Ambulatory Visit: Admission: RE | Admit: 2023-01-21 | Payer: BC Managed Care – PPO | Source: Ambulatory Visit

## 2023-01-21 DIAGNOSIS — N6320 Unspecified lump in the left breast, unspecified quadrant: Secondary | ICD-10-CM

## 2023-02-28 ENCOUNTER — Encounter: Payer: Self-pay | Admitting: Family Medicine

## 2023-04-22 ENCOUNTER — Encounter: Payer: Self-pay | Admitting: Family Medicine

## 2023-04-22 ENCOUNTER — Ambulatory Visit (INDEPENDENT_AMBULATORY_CARE_PROVIDER_SITE_OTHER): Payer: BC Managed Care – PPO | Admitting: Family Medicine

## 2023-04-22 VITALS — BP 136/80 | HR 69 | Temp 98.1°F | Wt 252.0 lb

## 2023-04-22 DIAGNOSIS — J40 Bronchitis, not specified as acute or chronic: Secondary | ICD-10-CM | POA: Diagnosis not present

## 2023-04-22 DIAGNOSIS — R059 Cough, unspecified: Secondary | ICD-10-CM | POA: Diagnosis not present

## 2023-04-22 LAB — POCT INFLUENZA A/B
Influenza A, POC: NEGATIVE
Influenza B, POC: NEGATIVE

## 2023-04-22 LAB — POC COVID19 BINAXNOW: SARS Coronavirus 2 Ag: NEGATIVE

## 2023-04-22 MED ORDER — HYDROCODONE BIT-HOMATROP MBR 5-1.5 MG/5ML PO SOLN: 5.0000 mL | ORAL | 0 refills | Status: DC | PRN

## 2023-04-22 MED ORDER — DOXYCYCLINE HYCLATE 100 MG PO TABS
100.0000 mg | ORAL_TABLET | Freq: Two times a day (BID) | ORAL | 0 refills | Status: DC
Start: 2023-04-22 — End: 2024-04-16

## 2023-04-22 MED ORDER — ALBUTEROL SULFATE HFA 108 (90 BASE) MCG/ACT IN AERS: 2.0000 | INHALATION_SPRAY | RESPIRATORY_TRACT | 0 refills | Status: DC | PRN

## 2023-04-22 NOTE — Addendum Note (Signed)
Addended by: Carola Rhine on: 04/22/2023 08:31 AM   Modules accepted: Orders

## 2023-04-22 NOTE — Progress Notes (Signed)
   Subjective:    Patient ID: Kim Riddle, female    DOB: 1975/07/22, 47 y.o.   MRN: [id number]  HPI Here for 3 days of chest tightness, SOB, and a dry cough. She has had fever off and on. Her 71 year old son is getting over a pneumonia.    Review of Systems  Constitutional:  Positive for fever.  HENT: Negative.    Eyes: Negative.   Respiratory:  Positive for cough, chest tightness and shortness of breath. Negative for wheezing.   Cardiovascular: Negative.        Objective:   Physical Exam Constitutional:      Appearance: Normal appearance.  HENT:     Right Ear: Tympanic membrane, ear canal and external ear normal.     Left Ear: Tympanic membrane, ear canal and external ear normal.     Nose: Nose normal.     Mouth/Throat:     Pharynx: Oropharynx is clear.  Eyes:     Conjunctiva/sclera: Conjunctivae normal.  Cardiovascular:     Rate and Rhythm: Normal rate and regular rhythm.     Pulses: Normal pulses.     Heart sounds: Normal heart sounds.  Pulmonary:     Effort: Pulmonary effort is normal.     Breath sounds: Normal breath sounds.  Lymphadenopathy:     Cervical: No cervical adenopathy.  Neurological:     Mental Status: She is alert.           Assessment & Plan:  Bronchitis, treat with 10 days of Doxycycline. Use the albuterol inhaler as needed.  Gershon Crane, MD

## 2023-05-02 ENCOUNTER — Encounter: Payer: Self-pay | Admitting: Family Medicine

## 2023-05-02 DIAGNOSIS — E119 Type 2 diabetes mellitus without complications: Secondary | ICD-10-CM

## 2023-05-06 MED ORDER — TIRZEPATIDE 2.5 MG/0.5ML ~~LOC~~ SOAJ
2.5000 mg | SUBCUTANEOUS | 5 refills | Status: DC
Start: 2023-05-06 — End: 2023-10-07

## 2023-05-06 NOTE — Telephone Encounter (Signed)
Pt's spouse called back to say insurance company needs the lab results. Spouse was informed Rx was already sent.

## 2023-05-06 NOTE — Telephone Encounter (Signed)
Pt's spouse calls says his insurance is stating this medication has been approved and they are still waiting for the prescription

## 2023-05-06 NOTE — Telephone Encounter (Signed)
I'm still not sure what the problem was but I sent in the script

## 2023-05-13 ENCOUNTER — Telehealth: Payer: Self-pay

## 2023-05-13 NOTE — Telephone Encounter (Signed)
Please advise what documentation shows patient A1c greater than 6.5%. I have looked through documents in media and did not see the copy of the lab. Please provide the lab results to resubmit PA or start the appeal process.

## 2023-05-13 NOTE — Telephone Encounter (Signed)
Please send this back to the prior auth folks -- she had an A1C of 7.2 in 05/30/2021-- we have a copy of this lab in the scanned section they will need to re-do the prior auth for the patient and use the Pre- treatment A1C as proof that she has type 2 diabetes.

## 2023-05-13 NOTE — Telephone Encounter (Signed)
Pharmacy Patient Advocate Encounter  Received notification from [employer] that Prior Authorization for [facility] has been DENIED.  Full denial letter will be uploaded to the media tab. See denial reason below.   PA #/Case ID/Reference #: [id number]   DENIAL REASON: No chart notes or lab test results showing that the diagnosis of type 2 diabetes has been confirmed by lab tests such as HgA1c greater than or equal to 6.5%

## 2023-05-13 NOTE — Telephone Encounter (Signed)
It's in the labs that were scanned in on 11/01/2021-- its labeled "Lab Results Scan"

## 2023-05-14 ENCOUNTER — Other Ambulatory Visit (HOSPITAL_COMMUNITY): Payer: Self-pay

## 2023-05-14 NOTE — Telephone Encounter (Signed)
Pharmacy Patient Advocate Encounter  Received notification from [facility]  that Prior Authorization for [facility] has been CANCELLED due to Previous denial on file. An appeal is required.    PA #/Case ID/Reference #: [id number]    Information has been sent to clinical pharmacist for appeals review. It may take 5-7 days to prepare the necessary documentation to request the appeal from the insurance.

## 2023-05-14 NOTE — Telephone Encounter (Signed)
Appeal has been submitted. Will advise when response is received or follow up in 1 week. Please be advised that most companies may take 30 days to make a decision.   Dellie Burns, PharmD Clinical Pharmacist [place]  Direct Dial: [phone]

## 2023-05-14 NOTE — Telephone Encounter (Addendum)
Pharmacy Patient Advocate Encounter   Received notification from Pt Calls Messages that prior authorization for [facility] is required/requested.   Insurance verification completed.   The patient is insured through [facility] [place]  .     Per test claim: PA required; PA submitted to above mentioned insurance via CoverMyMeds Key/confirmation #/EOC [id number] Status is pending

## 2023-05-20 ENCOUNTER — Other Ambulatory Visit (HOSPITAL_COMMUNITY): Payer: Self-pay

## 2023-06-04 ENCOUNTER — Other Ambulatory Visit (HOSPITAL_COMMUNITY): Payer: Self-pay

## 2023-06-11 NOTE — Telephone Encounter (Signed)
Per insurance, appeal was approved.

## 2023-06-11 NOTE — Telephone Encounter (Signed)
Spoke with pharmacist at [facility] and informed her of the approval as below.  She stated the Rx is out of stock at this time and the patient will be contacted once this is received.  Mychart message sent to the patient with this information.

## 2023-09-04 LAB — OPHTHALMOLOGY REPORT-SCANNED

## 2023-10-01 ENCOUNTER — Other Ambulatory Visit (HOSPITAL_COMMUNITY): Payer: Self-pay

## 2023-10-07 ENCOUNTER — Telehealth: Payer: Self-pay | Admitting: Pharmacy Technician

## 2023-10-07 ENCOUNTER — Other Ambulatory Visit (HOSPITAL_COMMUNITY): Payer: Self-pay

## 2023-10-07 DIAGNOSIS — E119 Type 2 diabetes mellitus without complications: Secondary | ICD-10-CM

## 2023-10-07 MED ORDER — TIRZEPATIDE 5 MG/0.5ML ~~LOC~~ SOAJ: 5.0000 mg | SUBCUTANEOUS | 5 refills | Status: DC

## 2023-10-07 NOTE — Telephone Encounter (Signed)
 Please let pt know that we wil lcall in the 5 mg for her instad of the 2.5 mg per her insurance

## 2023-10-07 NOTE — Addendum Note (Signed)
 Addended by: Karie Georges on: 10/07/2023 03:20 PM   Modules accepted: Orders

## 2023-10-07 NOTE — Telephone Encounter (Signed)
 Pharmacy Patient Advocate Encounter   Received notification from CoverMyMeds that prior authorization for Mounjaro 2.5MG /0.5ML auto-injectors is required/requested.   Insurance verification completed.   The patient is insured through [employer]  .   Per test claim:  MOUNJARO 5MG  is preferred by the insurance.  If suggested medication is appropriate, Please send in a new RX and discontinue this one. If not, please advise as to why it's not appropriate so that we may request a Prior Authorization. Please note, some preferred medications may still require a PA.  If the suggested medications have not been trialed and there are no contraindications to their use, the PA will not be submitted, as it will not be approved.   Mounjaro 2.5mg  rejected with message Max Quantity 2 in 135 days but Mounjaro 5mg  went through with $0.00 copay they want the patient to titrate up

## 2023-10-08 NOTE — Telephone Encounter (Signed)
 Left a detailed message with the information below at the patient's cell number.

## 2023-11-07 ENCOUNTER — Other Ambulatory Visit: Payer: Self-pay | Admitting: Family Medicine

## 2023-11-07 DIAGNOSIS — F5104 Psychophysiologic insomnia: Secondary | ICD-10-CM

## 2023-11-07 NOTE — Telephone Encounter (Signed)
>  1 year since last visit, pt needs appt to continue her medications

## 2023-12-16 LAB — HM MAMMOGRAPHY

## 2024-04-09 ENCOUNTER — Ambulatory Visit: Payer: Self-pay

## 2024-04-09 NOTE — Telephone Encounter (Signed)
 FYI Only or Action Required?: Action required by provider: update on patient condition. New HTN-   Patient was last seen in primary care on 04/22/2023 by Johnny Garnette LABOR, MD.  Called Nurse Triage reporting Hypertension.  Symptoms began several weeks ago.  Interventions attempted: Rest, hydration, or home remedies.  Symptoms are: unchanged.  Triage Disposition: See PCP When Office is Open (Within 3 Days)  Patient/caregiver understands and will follow disposition?: Yes Copied from CRM [id number]. Topic: Clinical - Red Word Triage >> Apr 09, 2024 12:24 PM Kim Riddle wrote: Red Word that prompted transfer to Nurse Triage: Patient said her blood pressure has been high for the last few weeks. 170/111 and 69 pulse. Reason for Disposition  Systolic BP >= 160 OR Diastolic >= 100  Answer Assessment - Initial Assessment Questions For the past couple weeks patient has been getting high readings on her BP cuff at home. She is unsure how accurate it is but will go to nearest drug store to see if accurate. She endorses mild headache when elevated. Recent eye MD visit with no issues, just needed readers. Has vertigo spells at baseline. Unsure if worsening with BP. Chronic heartburn will occasional few seconds of chest burning. Recent increase in stress and has started working out. Denies ETOH or Cigarettes. Has started to meditate and it does help. She is hydrated.  She is on Mounjaro  for her DM and it does not seem to be helping anymore, unsure if affecting her BP. Appt made for 10/14 and placed on the waitlist. Strict call back/ED/UC precautions given with understanding.   1. BLOOD PRESSURE: What is your blood pressure? Did you take at least two measurements 5 minutes apart?     1215pm 170/111 2. ONSET: When did you take your blood pressure?     Couple of weeks 3. HOW: How did you take your blood pressure? (e.g., automatic home BP monitor, visiting nurse)     Home arm cuff 4. HISTORY: Do you have a  history of high blood pressure?     Family history 5. MEDICINES: Are you taking any medicines for blood pressure? Have you missed any doses recently?     Denies taking 6. OTHER SYMPTOMS: Do you have any symptoms? (e.g., blurred vision, chest pain, difficulty breathing, headache, weakness)     Recent eye exam normal, headaches.  Protocols used: Blood Pressure - High-A-AH

## 2024-04-10 NOTE — Telephone Encounter (Signed)
 Left a message for the patient to return my call.

## 2024-04-10 NOTE — Telephone Encounter (Signed)
 Patient called back and was informed of the instructions as below.  Appt was changed from 10/14 to 10/16 per patient's request.

## 2024-04-10 NOTE — Telephone Encounter (Signed)
 Pt has appt on 10/14 with me, please advise her to continue checking her BP at home daily when she is at rest (not in pain). She may take magnesium supplements starting at 200 mg daily to help lower her blood pressure.

## 2024-04-14 ENCOUNTER — Ambulatory Visit: Admitting: Family Medicine

## 2024-04-16 ENCOUNTER — Encounter: Admitting: Family Medicine

## 2024-04-16 ENCOUNTER — Ambulatory Visit (INDEPENDENT_AMBULATORY_CARE_PROVIDER_SITE_OTHER): Admitting: Family Medicine

## 2024-04-16 ENCOUNTER — Encounter: Payer: Self-pay | Admitting: Family Medicine

## 2024-04-16 VITALS — BP 144/88 | HR 67 | Temp 98.0°F | Ht 65.0 in | Wt 249.9 lb

## 2024-04-16 DIAGNOSIS — Z789 Other specified health status: Secondary | ICD-10-CM | POA: Diagnosis not present

## 2024-04-16 DIAGNOSIS — F5104 Psychophysiologic insomnia: Secondary | ICD-10-CM

## 2024-04-16 DIAGNOSIS — E119 Type 2 diabetes mellitus without complications: Secondary | ICD-10-CM | POA: Diagnosis not present

## 2024-04-16 DIAGNOSIS — Z0001 Encounter for general adult medical examination with abnormal findings: Secondary | ICD-10-CM

## 2024-04-16 DIAGNOSIS — I10 Essential (primary) hypertension: Secondary | ICD-10-CM | POA: Diagnosis not present

## 2024-04-16 DIAGNOSIS — Z1211 Encounter for screening for malignant neoplasm of colon: Secondary | ICD-10-CM

## 2024-04-16 DIAGNOSIS — Z0000 Encounter for general adult medical examination without abnormal findings: Secondary | ICD-10-CM | POA: Diagnosis not present

## 2024-04-16 DIAGNOSIS — Z7985 Long-term (current) use of injectable non-insulin antidiabetic drugs: Secondary | ICD-10-CM

## 2024-04-16 DIAGNOSIS — R110 Nausea: Secondary | ICD-10-CM

## 2024-04-16 DIAGNOSIS — E559 Vitamin D deficiency, unspecified: Secondary | ICD-10-CM

## 2024-04-16 LAB — COMPREHENSIVE METABOLIC PANEL WITH GFR
ALT: 25 U/L (ref 0–35)
AST: 30 U/L (ref 0–37)
Albumin: 4.4 g/dL (ref 3.5–5.2)
Alkaline Phosphatase: 55 U/L (ref 39–117)
BUN: 6 mg/dL (ref 6–23)
CO2: 23 meq/L (ref 19–32)
Calcium: 9.1 mg/dL (ref 8.4–10.5)
Chloride: 103 meq/L (ref 96–112)
Creatinine, Ser: 0.68 mg/dL (ref 0.40–1.20)
GFR: 104.88 mL/min (ref >60.00)
Glucose, Bld: 152 mg/dL — ABNORMAL HIGH (ref 70–99)
Potassium: 3.9 meq/L (ref 3.5–5.1)
Sodium: 138 meq/L (ref 135–145)
Total Bilirubin: 0.6 mg/dL (ref 0.2–1.2)
Total Protein: 7.8 g/dL (ref 6.0–8.3)

## 2024-04-16 LAB — POCT GLYCOSYLATED HEMOGLOBIN (HGB A1C): Hemoglobin A1C: 6.6 % — AB (ref 4.0–5.6)

## 2024-04-16 LAB — MICROALBUMIN / CREATININE URINE RATIO
Creatinine,U: 172.5 mg/dL
Microalb Creat Ratio: 15.7 mg/g (ref 0.0–30.0)
Microalb, Ur: 2.7 mg/dL — ABNORMAL HIGH (ref 0.0–1.9)

## 2024-04-16 LAB — LIPID PANEL
Cholesterol: 181 mg/dL (ref 0–200)
HDL: 56.1 mg/dL (ref >39.00)
LDL Cholesterol: 103 mg/dL — ABNORMAL HIGH (ref 0–99)
NonHDL: 124.85
Total CHOL/HDL Ratio: 3
Triglycerides: 107 mg/dL (ref 0.0–149.0)
VLDL: 21.4 mg/dL (ref 0.0–40.0)

## 2024-04-16 LAB — VITAMIN B12: Vitamin B-12: 347 pg/mL (ref 211–911)

## 2024-04-16 LAB — TSH: TSH: 2.44 u[IU]/mL (ref 0.35–5.50)

## 2024-04-16 LAB — VITAMIN D 25 HYDROXY (VIT D DEFICIENCY, FRACTURES): VITD: 23.74 ng/mL — ABNORMAL LOW (ref 30.00–100.00)

## 2024-04-16 MED ORDER — TIRZEPATIDE 5 MG/0.5ML ~~LOC~~ SOAJ: 5.0000 mg | SUBCUTANEOUS | 5 refills | Status: AC

## 2024-04-16 MED ORDER — OLMESARTAN MEDOXOMIL 5 MG PO TABS: 10.0000 mg | ORAL_TABLET | Freq: Every day | ORAL | 5 refills | Status: DC

## 2024-04-16 MED ORDER — PROMETHAZINE HCL 12.5 MG PO TABS: 12.5000 mg | ORAL_TABLET | Freq: Three times a day (TID) | ORAL | 0 refills | Status: AC | PRN

## 2024-04-16 MED ORDER — AMITRIPTYLINE HCL 50 MG PO TABS: 50.0000 mg | ORAL_TABLET | Freq: Every day | ORAL | 1 refills | Status: AC

## 2024-04-16 NOTE — Assessment & Plan Note (Signed)
 Readings elevated here and at home, will start olmesartan and have patient titrate up at home until her BP is controlled. She will follow up with me in 3 months for BP recheck and A1C.

## 2024-04-16 NOTE — Assessment & Plan Note (Signed)
 A1C is well controlled today at 6.6, we discussed this and decided to continue her current dose while trying to help with the side effects she is experiencing. Labs ordered, foot exam performed.

## 2024-04-16 NOTE — Progress Notes (Signed)
 Complete physical exam  Patient: Kim Riddle   DOB: 10-30-1975   47 y.o. Female  MRN: [id number]  Subjective:    Chief Complaint  Patient presents with   Hypertension   Medication Problem    Patient states she feels foggy and has severe acid reflux when taking Mounjaro     Kim Riddle is a 48 y.o. female who presents today for a complete physical exam. She reports consuming a vegetarian diet. Eats dairy and eggs, eats a lot of veggies, doesn't really eat any protein alternatives or beans. Home exercise routine includes started exercise at home, walks daily. She generally feels well. She reports sleeping lightly, wakes up almost every hour to use the bathroom. She does not have additional problems to discuss today.   Pt states that she has a history of high blood pressure, states that for the past couple of months. States she has had a little more stress lately. Repeat BP in office today was 144/88. We discussed BP medication and she is agreeable to starting medication.   States she also has been getting more acid reflux with the mounjaro , states that she spends the first 2 days nauseated after her injection. States she cannot take ondansetron . Reports she is taking PRN peptol bismol and occasional OTC prilosec for the GERD symptoms.   Most recent fall risk assessment:    04/22/2023    8:28 AM  Fall Risk   Falls in the past year? 0  Number falls in past yr: 0  Injury with Fall? 0  Risk for fall due to : No Fall Risks  Follow up Falls evaluation completed     Most recent depression screenings:    04/16/2024    2:25 PM 04/22/2023    8:29 AM  PHQ 2/9 Scores  PHQ - 2 Score 1 2  PHQ- 9 Score  7    Vision:Within last year and Dental: No current dental problems and Receives regular dental care  Patient Active Problem List   Diagnosis Date Noted   Primary hypertension 04/16/2024   Controlled type 2 diabetes mellitus without complication, without long-term  current use of insulin (HCC) 04/05/2022   Heart palpitations 04/05/2022   Iron deficiency anemia 04/05/2022   Hypothyroidism (acquired) 04/05/2022   Nausea/vomiting in pregnancy 12/10/2011   Acid reflux 12/10/2011      Patient Care Team: Ozell Heron HERO, MD as PCP - General (Family Medicine)   Outpatient Medications Prior to Visit  Medication Sig   [DISCONTINUED] amitriptyline  (ELAVIL ) 50 MG tablet Take 1 tablet (50 mg total) by mouth at bedtime.   [DISCONTINUED] tirzepatide  (MOUNJARO ) 5 MG/0.5ML Pen Inject 5 mg into the skin once a week.   [DISCONTINUED] albuterol  (VENTOLIN  HFA) 108 (90 Base) MCG/ACT inhaler Inhale 2 puffs into the lungs every 4 (four) hours as needed for wheezing or shortness of breath.   [DISCONTINUED] doxycycline  (VIBRA -TABS) 100 MG tablet Take 1 tablet (100 mg total) by mouth 2 (two) times daily.   [DISCONTINUED] ferrous sulfate  300 (60 Fe) MG/5ML syrup Take 5 mLs (300 mg total) by mouth daily.   [DISCONTINUED] HYDROcodone  bit-homatropine (HYCODAN) 5-1.5 MG/5ML syrup Take 5 mLs by mouth every 4 (four) hours as needed.   No facility-administered medications prior to visit.    Review of Systems  HENT:  Negative for hearing loss.   Eyes:  Negative for blurred vision.  Respiratory:  Negative for shortness of breath.   Cardiovascular:  Negative for chest pain.  Gastrointestinal: Negative.   Genitourinary: Negative.  Musculoskeletal:  Negative for back pain.  Neurological:  Negative for headaches.  Psychiatric/Behavioral:  Negative for depression.        Objective:     BP (!) 144/88 (BP Location: Left Arm, Patient Position: Sitting, Cuff Size: Large)   Pulse 67   Temp 98 F (36.7 C) (Oral)   Ht 5' 5 (1.651 m)   Wt 249 lb 14.4 oz (113.4 kg)   LMP 04/09/2024 (Exact Date)   SpO2 98%   BMI 41.59 kg/m    Physical Exam Vitals reviewed.  Constitutional:      Appearance: Normal appearance. She is well-groomed. She is morbidly obese.  HENT:      Right Ear: Tympanic membrane and ear canal normal.     Left Ear: Tympanic membrane and ear canal normal.     Mouth/Throat:     Mouth: Mucous membranes are moist.     Pharynx: No posterior oropharyngeal erythema.  Eyes:     Conjunctiva/sclera: Conjunctivae normal.  Neck:     Thyroid: No thyromegaly.  Cardiovascular:     Rate and Rhythm: Normal rate and regular rhythm.     Pulses: Normal pulses.     Heart sounds: S1 normal and S2 normal.  Pulmonary:     Effort: Pulmonary effort is normal.     Breath sounds: Normal breath sounds and air entry.  Abdominal:     General: Abdomen is flat. Bowel sounds are normal.     Palpations: Abdomen is soft.  Musculoskeletal:     Right lower leg: No edema.     Left lower leg: No edema.  Lymphadenopathy:     Cervical: No cervical adenopathy.  Neurological:     Mental Status: She is alert and oriented to person, place, and time. Mental status is at baseline.     Gait: Gait is intact.  Psychiatric:        Mood and Affect: Mood and affect normal.        Speech: Speech normal.        Behavior: Behavior normal.        Judgment: Judgment normal.     Diabetic Foot Exam - Simple   Simple Foot Form Diabetic Foot exam was performed with the following findings: Yes 04/16/2024  9:07 AM  Visual Inspection No deformities, no ulcerations, no other skin breakdown bilaterally: Yes Sensation Testing Intact to touch and monofilament testing bilaterally: Yes Pulse Check Posterior Tibialis and Dorsalis pulse intact bilaterally: Yes Comments         Assessment & Plan:    Routine Health Maintenance and Physical Exam  Immunization History  Administered Date(s) Administered   Moderna Sars-Covid-2 Vaccination 11/05/2019, 01/05/2020   Tdap 07/02/2012    Health Maintenance  Topic Date Due   OPHTHALMOLOGY EXAM  Never done   Pneumococcal Vaccine (1 of 2 - PCV) Never done   Hepatitis B Vaccines 19-59 Average Risk (1 of 3 - 19+ 3-dose series) Never done    HPV VACCINES (1 - 3-dose SCDM series) Never done   Cervical Cancer Screening (HPV/Pap Cotest)  04/12/2022   DTaP/Tdap/Td (2 - Td or Tdap) 07/02/2022   Colonoscopy  Never done   COVID-19 Vaccine (3 - 2025-26 season) 03/02/2024   Influenza Vaccine  09/29/2024 (Originally 01/31/2024)   Hepatitis C Screening  04/16/2025 (Originally 06/20/1996)   HEMOGLOBIN A1C  10/15/2024   Diabetic kidney evaluation - eGFR measurement  04/16/2025   Diabetic kidney evaluation - Urine ACR  04/16/2025   FOOT EXAM  04/16/2025  Mammogram  12/15/2025   HIV Screening  Completed   Meningococcal B Vaccine  Aged Out    Discussed health benefits of physical activity, and encouraged her to engage in regular exercise appropriate for her age and condition.  Encounter for routine adult health examination with abnormal findings  Controlled type 2 diabetes mellitus without complication, without long-term current use of insulin (HCC) Assessment & Plan: A1C is well controlled today at 6.6, we discussed this and decided to continue her current dose while trying to help with the side effects she is experiencing. Labs ordered, foot exam performed.   Orders: -     POCT glycosylated hemoglobin (Hb A1C) -     Collection capillary blood specimen -     Tirzepatide ; Inject 5 mg into the skin once a week.  Dispense: 2 mL; Refill: 5 -     Comprehensive metabolic panel with GFR; Future -     Lipid panel; Future -     Microalbumin / creatinine urine ratio; Future  Nausea -     Promethazine  HCl; Take 1 tablet (12.5 mg total) by mouth every 8 (eight) hours as needed for nausea or vomiting.  Dispense: 20 tablet; Refill: 0  Chronic insomnia -     Amitriptyline  HCl; Take 1 tablet (50 mg total) by mouth at bedtime.  Dispense: 90 tablet; Refill: 1  Colon cancer screening -     Cologuard  Vegetarian diet -     Vitamin B12; Future -     VITAMIN D 25 Hydroxy (Vit-D Deficiency, Fractures); Future  Vitamin D deficiency -     VITAMIN D  25 Hydroxy (Vit-D Deficiency, Fractures); Future  Primary hypertension Assessment & Plan: Readings elevated here and at home, will start olmesartan and have patient titrate up at home until her BP is controlled. She will follow up with me in 3 months for BP recheck and A1C.  Orders: -     Olmesartan Medoxomil; Take 2 tablets (10 mg total) by mouth daily.  Dispense: 60 tablet; Refill: 5 -     TSH; Future   General physical exam findings are normal today except for elevated blood pressure. I reviewed the patient's preventative testing, immunizations, and lifestyle habits. I made appropriate recommendations and placed orders for the appropriate tests and/or vaccinations. I counseled the patient on the CDC's recommendations for healthy exercise and diet. I counseled the patient on healthy sleep habits and stress management. Handouts to reinforce the counseling were given at the conclusion of the visit.   Return in about 3 months (around 07/17/2024) for HTN.     Heron CHRISTELLA Sharper, MD

## 2024-04-19 ENCOUNTER — Encounter: Payer: Self-pay | Admitting: Family Medicine

## 2024-04-20 ENCOUNTER — Ambulatory Visit: Payer: Self-pay | Admitting: Family Medicine

## 2024-05-06 LAB — COLOGUARD: COLOGUARD: NEGATIVE

## 2024-07-14 ENCOUNTER — Encounter: Payer: Self-pay | Admitting: Family Medicine

## 2024-07-14 DIAGNOSIS — I10 Essential (primary) hypertension: Secondary | ICD-10-CM

## 2024-07-15 ENCOUNTER — Ambulatory Visit: Admitting: Family Medicine

## 2024-07-15 MED ORDER — OLMESARTAN MEDOXOMIL 20 MG PO TABS: 20.0000 mg | ORAL_TABLET | Freq: Every day | ORAL | 2 refills | Status: AC

## 2024-08-04 ENCOUNTER — Telehealth: Payer: Self-pay

## 2024-08-04 ENCOUNTER — Other Ambulatory Visit (HOSPITAL_COMMUNITY): Payer: Self-pay

## 2024-08-04 NOTE — Telephone Encounter (Signed)
 Pharmacy Patient Advocate Encounter   Received notification from [employer] CMM KEY that prior authorization for Mounjaro  5MG /0.5ML auto-injectors  is required/requested.   Insurance verification completed.   The patient is insured through [facility].   Per test claim: PA required; PA submitted to above mentioned insurance via Latent Key/confirmation #/EOC [id number] Status is pending

## 2024-08-05 ENCOUNTER — Other Ambulatory Visit (HOSPITAL_COMMUNITY): Payer: Self-pay

## 2024-08-05 NOTE — Telephone Encounter (Signed)
 Pharmacy Patient Advocate Encounter  Received notification from [employer] that Prior Authorization for Mounjaro  5MG /0.5ML auto-injectors   has been APPROVED from 08/04/2024 to 08/04/2025. Ran test claim, Copay is $25.00. This test claim was processed through [facility]- copay amounts may vary at other pharmacies due to pharmacy/plan contracts, or as the patient moves through the different stages of their insurance plan.   PA #/Case ID/Reference #:  [id number]. MOUNJARO  INJ 5MG /0.5 is APPROVED

## 2024-08-05 NOTE — Telephone Encounter (Signed)
 Left a detailed message with the approval information below on the pharmacy voicemail at [facility].

## 2024-08-12 ENCOUNTER — Ambulatory Visit: Admitting: Family Medicine

## 2024-09-01 ENCOUNTER — Ambulatory Visit: Admitting: Family Medicine
